# Patient Record
Sex: Female | Born: 1970 | Race: White | Hispanic: No | Marital: Single | Smoking: Never smoker
Health system: Southern US, Community
[De-identification: ages and names within clinical notes are randomized; demographics above are authoritative.]

## PROBLEM LIST (undated history)

## (undated) DIAGNOSIS — F32A Depression, unspecified: Secondary | ICD-10-CM

## (undated) HISTORY — PX: ROUX-EN-Y GASTRIC BYPASS: SHX1104

---

## 2020-10-03 ENCOUNTER — Emergency Department (HOSPITAL_COMMUNITY): Payer: BC Managed Care – PPO

## 2020-10-03 ENCOUNTER — Emergency Department (HOSPITAL_COMMUNITY)
Admission: EM | Admit: 2020-10-03 | Discharge: 2020-10-03 | Disposition: A | Discharge status: Home / Self Care | Payer: BC Managed Care – PPO | Attending: Emergency Medicine | Admitting: Emergency Medicine

## 2020-10-03 DIAGNOSIS — S2231XD Fracture of one rib, right side, subsequent encounter for fracture with routine healing: Secondary | ICD-10-CM | POA: Insufficient documentation

## 2020-10-03 DIAGNOSIS — Y92410 Unspecified street and highway as the place of occurrence of the external cause: Secondary | ICD-10-CM | POA: Insufficient documentation

## 2020-10-03 DIAGNOSIS — Y999 Unspecified external cause status: Secondary | ICD-10-CM | POA: Insufficient documentation

## 2020-10-03 DIAGNOSIS — S199XXA Unspecified injury of neck, initial encounter: Secondary | ICD-10-CM | POA: Diagnosis present

## 2020-10-03 DIAGNOSIS — S161XXA Strain of muscle, fascia and tendon at neck level, initial encounter: Secondary | ICD-10-CM | POA: Diagnosis not present

## 2020-10-03 DIAGNOSIS — Y9389 Activity, other specified: Secondary | ICD-10-CM | POA: Diagnosis not present

## 2020-10-03 DIAGNOSIS — S0990XA Unspecified injury of head, initial encounter: Secondary | ICD-10-CM | POA: Diagnosis not present

## 2020-10-03 DIAGNOSIS — S2231XA Fracture of one rib, right side, initial encounter for closed fracture: Secondary | ICD-10-CM

## 2020-10-03 MED ORDER — FENTANYL CITRATE (PF) 100 MCG/2ML IJ SOLN
50.0000 ug | Freq: Once | INTRAMUSCULAR | Status: AC
  Administered 2020-10-03: 50 ug via INTRAVENOUS
  Filled 2020-10-03: qty 2

## 2020-10-03 MED ORDER — METHOCARBAMOL 500 MG PO TABS: 500.0000 mg | ORAL_TABLET | Freq: Two times a day (BID) | ORAL | 0 refills | Status: DC
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
## 2021-10-05 ENCOUNTER — Observation Stay: Payer: No Typology Code available for payment source

## 2021-10-05 ENCOUNTER — Emergency Department: Payer: No Typology Code available for payment source

## 2021-10-05 ENCOUNTER — Other Ambulatory Visit: Payer: Self-pay

## 2021-10-05 ENCOUNTER — Inpatient Hospital Stay
Admission: EM | Admit: 2021-10-05 | Discharge: 2021-10-16 | DRG: 552 | Disposition: A | Discharge status: Home / Self Care | Payer: No Typology Code available for payment source | Attending: Internal Medicine | Admitting: Internal Medicine

## 2021-10-05 ENCOUNTER — Encounter: Payer: Self-pay | Admitting: Internal Medicine

## 2021-10-05 DIAGNOSIS — E66813 Obesity, class 3: Secondary | ICD-10-CM | POA: Diagnosis present

## 2021-10-05 DIAGNOSIS — Z79899 Other long term (current) drug therapy: Secondary | ICD-10-CM

## 2021-10-05 DIAGNOSIS — S32010A Wedge compression fracture of first lumbar vertebra, initial encounter for closed fracture: Principal | ICD-10-CM | POA: Diagnosis present

## 2021-10-05 DIAGNOSIS — F32A Depression, unspecified: Secondary | ICD-10-CM | POA: Diagnosis present

## 2021-10-05 DIAGNOSIS — N390 Urinary tract infection, site not specified: Secondary | ICD-10-CM | POA: Diagnosis present

## 2021-10-05 DIAGNOSIS — Z6841 Body Mass Index (BMI) 40.0 and over, adult: Secondary | ICD-10-CM

## 2021-10-05 DIAGNOSIS — Y92410 Unspecified street and highway as the place of occurrence of the external cause: Secondary | ICD-10-CM

## 2021-10-05 DIAGNOSIS — Z8249 Family history of ischemic heart disease and other diseases of the circulatory system: Secondary | ICD-10-CM

## 2021-10-05 DIAGNOSIS — S32000A Wedge compression fracture of unspecified lumbar vertebra, initial encounter for closed fracture: Secondary | ICD-10-CM | POA: Diagnosis present

## 2021-10-05 DIAGNOSIS — F419 Anxiety disorder, unspecified: Secondary | ICD-10-CM | POA: Diagnosis present

## 2021-10-05 DIAGNOSIS — Z9884 Bariatric surgery status: Secondary | ICD-10-CM

## 2021-10-05 DIAGNOSIS — J45909 Unspecified asthma, uncomplicated: Secondary | ICD-10-CM | POA: Diagnosis present

## 2021-10-05 DIAGNOSIS — Z9189 Other specified personal risk factors, not elsewhere classified: Secondary | ICD-10-CM

## 2021-10-05 DIAGNOSIS — K5900 Constipation, unspecified: Secondary | ICD-10-CM | POA: Clinically undetermined

## 2021-10-05 DIAGNOSIS — E119 Type 2 diabetes mellitus without complications: Secondary | ICD-10-CM

## 2021-10-05 DIAGNOSIS — Z20822 Contact with and (suspected) exposure to covid-19: Secondary | ICD-10-CM | POA: Diagnosis present

## 2021-10-05 HISTORY — DX: Morbid (severe) obesity due to excess calories: E66.01

## 2021-10-05 HISTORY — DX: Depression, unspecified: F32.A

## 2021-10-05 LAB — CBC WITH DIFFERENTIAL/PLATELET
Abs Immature Granulocytes: 0.2 10*3/uL — ABNORMAL HIGH (ref 0.00–0.07)
Basophils Absolute: 0 10*3/uL (ref 0.0–0.1)
Basophils Relative: 0 %
Eosinophils Absolute: 0 10*3/uL (ref 0.0–0.5)
Eosinophils Relative: 0 %
HCT: 41.6 % (ref 36.0–46.0)
Hemoglobin: 13.2 g/dL (ref 12.0–15.0)
Immature Granulocytes: 1 %
Lymphocytes Relative: 18 %
Lymphs Abs: 2.5 10*3/uL (ref 0.7–4.0)
MCH: 28.8 pg (ref 26.0–34.0)
MCHC: 31.7 g/dL (ref 30.0–36.0)
MCV: 90.6 fL (ref 80.0–100.0)
Monocytes Absolute: 0.4 10*3/uL (ref 0.1–1.0)
Monocytes Relative: 3 %
Neutro Abs: 10.9 10*3/uL — ABNORMAL HIGH (ref 1.7–7.7)
Neutrophils Relative %: 78 %
Platelets: 262 10*3/uL (ref 150–400)
RBC: 4.59 MIL/uL (ref 3.87–5.11)
RDW: 12.9 % (ref 11.5–15.5)
WBC: 14 10*3/uL — ABNORMAL HIGH (ref 4.0–10.5)
nRBC: 0 % (ref 0.0–0.2)

## 2021-10-05 LAB — COMPREHENSIVE METABOLIC PANEL
ALT: 20 U/L (ref 0–44)
AST: 26 U/L (ref 15–41)
Albumin: 4 g/dL (ref 3.5–5.0)
Alkaline Phosphatase: 65 U/L (ref 38–126)
Anion gap: 9 (ref 5–15)
BUN: 19 mg/dL (ref 6–20)
CO2: 21 mmol/L — ABNORMAL LOW (ref 22–32)
Calcium: 9.1 mg/dL (ref 8.9–10.3)
Chloride: 107 mmol/L (ref 98–111)
Creatinine, Ser: 0.65 mg/dL (ref 0.44–1.00)
GFR, Estimated: >60 mL/min (ref >60)
Glucose, Bld: 149 mg/dL — ABNORMAL HIGH (ref 70–99)
Potassium: 4.2 mmol/L (ref 3.5–5.1)
Sodium: 137 mmol/L (ref 135–145)
Total Bilirubin: 0.7 mg/dL (ref 0.3–1.2)
Total Protein: 7.7 g/dL (ref 6.5–8.1)

## 2021-10-05 LAB — PROTIME-INR
INR: 1.1 (ref 0.8–1.2)
Prothrombin Time: 14 seconds (ref 11.4–15.2)

## 2021-10-05 MED ORDER — FENTANYL CITRATE PF 50 MCG/ML IJ SOSY
25.0000 ug | PREFILLED_SYRINGE | INTRAMUSCULAR | Status: AC | PRN
  Administered 2021-10-06 (×3): 25 ug via INTRAVENOUS
  Filled 2021-10-05 (×3): qty 1

## 2021-10-05 MED ORDER — ENOXAPARIN SODIUM 60 MG/0.6ML IJ SOSY
0.5000 mg/kg | PREFILLED_SYRINGE | INTRAMUSCULAR | Status: DC
  Administered 2021-10-06 – 2021-10-15 (×9): 52.5 mg via SUBCUTANEOUS
  Filled 2021-10-05 (×11): qty 0.6

## 2021-10-05 MED ORDER — SODIUM CHLORIDE 0.9 % IV BOLUS
500.0000 mL | Freq: Once | INTRAVENOUS | Status: AC
  Administered 2021-10-05: 21:00:00 500 mL via INTRAVENOUS

## 2021-10-05 MED ORDER — ACETAMINOPHEN 650 MG RE SUPP
650.0000 mg | Freq: Four times a day (QID) | RECTAL | Status: AC | PRN
  Filled 2021-10-05: qty 1

## 2021-10-05 MED ORDER — MORPHINE SULFATE (PF) 2 MG/ML IV SOLN
2.0000 mg | Freq: Four times a day (QID) | INTRAVENOUS | Status: AC | PRN
  Administered 2021-10-06 – 2021-10-07 (×3): 2 mg via INTRAVENOUS
  Filled 2021-10-05 (×4): qty 1

## 2021-10-05 MED ORDER — INSULIN ASPART 100 UNIT/ML IJ SOLN
0.0000 [IU] | Freq: Three times a day (TID) | INTRAMUSCULAR | Status: DC
  Administered 2021-10-06 (×3): 2 [IU] via SUBCUTANEOUS
  Administered 2021-10-07: 3 [IU] via SUBCUTANEOUS
  Administered 2021-10-07 – 2021-10-12 (×4): 2 [IU] via SUBCUTANEOUS
  Administered 2021-10-13: 5 [IU] via SUBCUTANEOUS
  Administered 2021-10-14 – 2021-10-16 (×3): 2 [IU] via SUBCUTANEOUS
  Filled 2021-10-05 (×10): qty 1

## 2021-10-05 MED ORDER — AMITRIPTYLINE HCL 50 MG PO TABS: 25.0000 mg | ORAL_TABLET | Freq: Every day | ORAL | Status: DC

## 2021-10-05 MED ORDER — ONDANSETRON HCL 4 MG/2ML IJ SOLN
4.0000 mg | Freq: Four times a day (QID) | INTRAMUSCULAR | Status: DC | PRN
  Administered 2021-10-06: 4 mg via INTRAVENOUS
  Filled 2021-10-05: qty 2

## 2021-10-05 MED ORDER — OXYCODONE-ACETAMINOPHEN 5-325 MG PO TABS
1.0000 | ORAL_TABLET | Freq: Once | ORAL | Status: AC
  Administered 2021-10-05: 16:00:00 1 via ORAL
  Filled 2021-10-05: qty 1

## 2021-10-05 MED ORDER — SODIUM CHLORIDE 0.9 % IV BOLUS
500.0000 mL | Freq: Once | INTRAVENOUS | Status: AC
  Administered 2021-10-05: 19:00:00 500 mL via INTRAVENOUS

## 2021-10-05 MED ORDER — SEMAGLUTIDE(0.25 OR 0.5MG/DOS) 2 MG/1.5ML ~~LOC~~ SOPN: 0.5000 mg | PEN_INJECTOR | SUBCUTANEOUS | Status: DC

## 2021-10-05 MED ORDER — ONDANSETRON HCL 4 MG PO TABS: 4.0000 mg | ORAL_TABLET | Freq: Four times a day (QID) | ORAL | Status: DC | PRN

## 2021-10-05 MED ORDER — AMITRIPTYLINE HCL 10 MG PO TABS
10.0000 mg | ORAL_TABLET | Freq: Every day | ORAL | Status: DC
  Administered 2021-10-06 – 2021-10-15 (×10): 10 mg via ORAL
  Filled 2021-10-05 (×13): qty 1

## 2021-10-05 MED ORDER — METFORMIN HCL ER 500 MG PO TB24
500.0000 mg | ORAL_TABLET | Freq: Two times a day (BID) | ORAL | Status: DC
  Administered 2021-10-07 – 2021-10-11 (×8): 500 mg via ORAL
  Filled 2021-10-05 (×9): qty 1

## 2021-10-05 MED ORDER — IOHEXOL 300 MG/ML  SOLN
100.0000 mL | Freq: Once | INTRAMUSCULAR | Status: AC | PRN
  Administered 2021-10-05: 21:00:00 100 mL via INTRAVENOUS
  Filled 2021-10-05: qty 100

## 2021-10-05 MED ORDER — TOPIRAMATE 100 MG PO TABS
100.0000 mg | ORAL_TABLET | Freq: Every day | ORAL | Status: DC
  Administered 2021-10-06 – 2021-10-15 (×10): 100 mg via ORAL
  Filled 2021-10-05 (×3): qty 1
  Filled 2021-10-05: qty 4
  Filled 2021-10-05 (×8): qty 1

## 2021-10-05 MED ORDER — INSULIN ASPART 100 UNIT/ML IJ SOLN
0.0000 [IU] | Freq: Every day | INTRAMUSCULAR | Status: DC
  Filled 2021-10-05: qty 1

## 2021-10-05 MED ORDER — DULOXETINE HCL 30 MG PO CPEP
60.0000 mg | ORAL_CAPSULE | Freq: Every day | ORAL | Status: DC
  Administered 2021-10-06 – 2021-10-15 (×11): 60 mg via ORAL
  Filled 2021-10-05 (×5): qty 2
  Filled 2021-10-05: qty 1
  Filled 2021-10-05 (×5): qty 2

## 2021-10-05 MED ORDER — MORPHINE SULFATE (PF) 2 MG/ML IV SOLN
2.0000 mg | Freq: Once | INTRAVENOUS | Status: AC
  Administered 2021-10-05: 19:00:00 2 mg via INTRAVENOUS
  Filled 2021-10-05: qty 1

## 2021-10-05 MED ORDER — ACETAMINOPHEN 325 MG PO TABS
650.0000 mg | ORAL_TABLET | Freq: Four times a day (QID) | ORAL | Status: AC | PRN
  Administered 2021-10-06 (×3): 650 mg via ORAL
  Filled 2021-10-05 (×3): qty 2

## 2021-10-05 MED ORDER — FENTANYL CITRATE PF 50 MCG/ML IJ SOSY
50.0000 ug | PREFILLED_SYRINGE | Freq: Once | INTRAMUSCULAR | Status: AC
  Administered 2021-10-05: 21:00:00 50 ug via INTRAVENOUS
  Filled 2021-10-05: qty 1
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
## 2021-10-05 NOTE — ED Notes (Signed)
Called (jamie) for LSO brace
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
## 2021-10-05 NOTE — Progress Notes (Signed)
PHARMACIST - PHYSICIAN COMMUNICATION  CONCERNING:  Enoxaparin (Lovenox) for DVT Prophylaxis    RECOMMENDATION: Patient was prescribed enoxaprin 40mg  q24 hours for VTE prophylaxis.   Filed Weights   10/05/21 1558  Weight: 106.6 kg (235 lb)    Body mass index is 40.34 kg/m.  Estimated Creatinine Clearance: 98 mL/min (by C-G formula based on SCr of 0.65 mg/dL).   Based on [facility] policy patient is candidate for enoxaparin 0.5mg /kg TBW SQ every 24 hours based on BMI being >30.  DESCRIPTION: Pharmacy has adjusted enoxaparin dose per [facility] policy.  Patient is now receiving enoxaparin 0.5 mg/kg every 24 hours   [facility], PharmD, [facility] 10/05/2021 10:33 PM

## 2021-10-05 NOTE — ED Notes (Signed)
Spoke with Diplomatic Services operational officer who stated she will order brace

## 2021-10-05 NOTE — ED Notes (Signed)
Pt taken for CT 

## 2021-10-06 ENCOUNTER — Observation Stay: Payer: No Typology Code available for payment source

## 2021-10-06 DIAGNOSIS — S32010A Wedge compression fracture of first lumbar vertebra, initial encounter for closed fracture: Principal | ICD-10-CM

## 2021-10-06 DIAGNOSIS — E119 Type 2 diabetes mellitus without complications: Secondary | ICD-10-CM | POA: Diagnosis not present

## 2021-10-06 DIAGNOSIS — F32A Depression, unspecified: Secondary | ICD-10-CM | POA: Diagnosis not present

## 2021-10-06 LAB — BASIC METABOLIC PANEL
Anion gap: 9 (ref 5–15)
BUN: 14 mg/dL (ref 6–20)
CO2: 23 mmol/L (ref 22–32)
Calcium: 9 mg/dL (ref 8.9–10.3)
Chloride: 105 mmol/L (ref 98–111)
Creatinine, Ser: 0.57 mg/dL (ref 0.44–1.00)
GFR, Estimated: >60 mL/min (ref >60)
Glucose, Bld: 170 mg/dL — ABNORMAL HIGH (ref 70–99)
Potassium: 3.6 mmol/L (ref 3.5–5.1)
Sodium: 137 mmol/L (ref 135–145)

## 2021-10-06 LAB — HEMOGLOBIN A1C
Hgb A1c MFr Bld: 6.5 % — ABNORMAL HIGH (ref 4.8–5.6)
Mean Plasma Glucose: 139.85 mg/dL

## 2021-10-06 LAB — GLUCOSE, CAPILLARY: Glucose-Capillary: 115 mg/dL — ABNORMAL HIGH (ref 70–99)

## 2021-10-06 LAB — CBC
HCT: 41.8 % (ref 36.0–46.0)
Hemoglobin: 13.3 g/dL (ref 12.0–15.0)
MCH: 28.9 pg (ref 26.0–34.0)
MCHC: 31.8 g/dL (ref 30.0–36.0)
MCV: 90.9 fL (ref 80.0–100.0)
Platelets: 243 10*3/uL (ref 150–400)
RBC: 4.6 MIL/uL (ref 3.87–5.11)
RDW: 13 % (ref 11.5–15.5)
WBC: 11.1 10*3/uL — ABNORMAL HIGH (ref 4.0–10.5)
nRBC: 0 % (ref 0.0–0.2)

## 2021-10-06 LAB — CBG MONITORING, ED
Glucose-Capillary: 141 mg/dL — ABNORMAL HIGH (ref 70–99)
Glucose-Capillary: 123 mg/dL — ABNORMAL HIGH (ref 70–99)
Glucose-Capillary: 134 mg/dL — ABNORMAL HIGH (ref 70–99)

## 2021-10-06 LAB — ETHANOL: Alcohol, Ethyl (B): <10 mg/dL (ref <10)

## 2021-10-06 LAB — HIV ANTIBODY (ROUTINE TESTING W REFLEX): HIV Screen 4th Generation wRfx: NONREACTIVE

## 2021-10-06 MED ORDER — OXYCODONE HCL 5 MG PO TABS
5.0000 mg | ORAL_TABLET | Freq: Four times a day (QID) | ORAL | Status: DC | PRN
  Administered 2021-10-06 – 2021-10-07 (×3): 5 mg via ORAL
  Filled 2021-10-06 (×3): qty 1

## 2021-10-06 MED ORDER — METHOCARBAMOL 500 MG PO TABS
750.0000 mg | ORAL_TABLET | Freq: Once | ORAL | Status: AC
  Administered 2021-10-06: 750 mg via ORAL
  Filled 2021-10-06: qty 2
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
## 2021-10-06 NOTE — Progress Notes (Signed)
OT Cancellation Note  Patient Details Name: Rebekah Simon MRN: [id number] DOB: 02-23-1971   Cancelled Treatment:    Reason Eval/Treat Not Completed: Other (comment). Consult received, chart reviewed. Neurosurgery consult indicates plan of care pending MRI results, pt NPO. Will hold OT evaluation at this time and re-attempt once plan of care has been established to ensure any necessary precautions are followed with ADL/mobility.    Arman Filter., MPH, MS, OTR/L ascom [phone] 10/06/21, 3:47 PM

## 2021-10-06 NOTE — ED Notes (Signed)
CBG - 141 ° °
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
## 2021-10-06 NOTE — ED Notes (Signed)
Pt taken to MRI  

## 2021-10-06 NOTE — ED Notes (Signed)
Pt back in bed, repositioned for comfort, placed on side per request. Face washed. Sheets changed. Pt appreciative.

## 2021-10-06 NOTE — ED Notes (Signed)
Pts daughter at bedside, pt placed on bedpan, c/o pain with movement.

## 2021-10-06 NOTE — ED Notes (Signed)
Back brace placed per instruction by person handing off brace, pt tolerated well. Pt was at MRI when brace arrived.

## 2021-10-07 DIAGNOSIS — S32010A Wedge compression fracture of first lumbar vertebra, initial encounter for closed fracture: Secondary | ICD-10-CM | POA: Diagnosis not present

## 2021-10-07 DIAGNOSIS — F32A Depression, unspecified: Secondary | ICD-10-CM | POA: Diagnosis not present

## 2021-10-07 DIAGNOSIS — E119 Type 2 diabetes mellitus without complications: Secondary | ICD-10-CM | POA: Diagnosis not present

## 2021-10-07 LAB — URINALYSIS, COMPLETE (UACMP) WITH MICROSCOPIC
Bilirubin Urine: NEGATIVE
Glucose, UA: NEGATIVE mg/dL
Hgb urine dipstick: NEGATIVE
Ketones, ur: NEGATIVE mg/dL
Nitrite: NEGATIVE
Protein, ur: NEGATIVE mg/dL
Specific Gravity, Urine: 1.015 (ref 1.005–1.030)
WBC, UA: >50 WBC/hpf — ABNORMAL HIGH (ref 0–5)
pH: 7 (ref 5.0–8.0)

## 2021-10-07 LAB — RESP PANEL BY RT-PCR (FLU A&B, COVID) ARPGX2
Influenza A by PCR: NEGATIVE
Influenza B by PCR: NEGATIVE
SARS Coronavirus 2 by RT PCR: NEGATIVE

## 2021-10-07 LAB — GLUCOSE, CAPILLARY
Glucose-Capillary: 134 mg/dL — ABNORMAL HIGH (ref 70–99)
Glucose-Capillary: 144 mg/dL — ABNORMAL HIGH (ref 70–99)
Glucose-Capillary: 163 mg/dL — ABNORMAL HIGH (ref 70–99)
Glucose-Capillary: 91 mg/dL (ref 70–99)

## 2021-10-07 LAB — URINE DRUG SCREEN, QUALITATIVE (ARMC ONLY)
Amphetamines, Ur Screen: NOT DETECTED
Barbiturates, Ur Screen: NOT DETECTED
Benzodiazepine, Ur Scrn: NOT DETECTED
Cannabinoid 50 Ng, Ur ~~LOC~~: NOT DETECTED
Cocaine Metabolite,Ur ~~LOC~~: NOT DETECTED
MDMA (Ecstasy)Ur Screen: NOT DETECTED
Methadone Scn, Ur: NOT DETECTED
Opiate, Ur Screen: POSITIVE — AB
Phencyclidine (PCP) Ur S: NOT DETECTED
Tricyclic, Ur Screen: POSITIVE — AB

## 2021-10-07 MED ORDER — KETOROLAC TROMETHAMINE 30 MG/ML IJ SOLN
30.0000 mg | Freq: Four times a day (QID) | INTRAMUSCULAR | Status: AC | PRN
  Administered 2021-10-07 – 2021-10-11 (×9): 30 mg via INTRAVENOUS
  Filled 2021-10-07 (×9): qty 1

## 2021-10-07 MED ORDER — LIDOCAINE 5 % EX PTCH
1.0000 | MEDICATED_PATCH | CUTANEOUS | Status: DC
  Administered 2021-10-07 – 2021-10-15 (×9): 1 via TRANSDERMAL
  Filled 2021-10-07 (×10): qty 1

## 2021-10-07 MED ORDER — TRAMADOL HCL 50 MG PO TABS
100.0000 mg | ORAL_TABLET | Freq: Four times a day (QID) | ORAL | Status: DC | PRN
  Administered 2021-10-07 – 2021-10-16 (×19): 100 mg via ORAL
  Filled 2021-10-07 (×20): qty 2

## 2021-10-07 MED ORDER — OXYCODONE HCL 5 MG PO TABS
7.5000 mg | ORAL_TABLET | Freq: Four times a day (QID) | ORAL | Status: DC | PRN
  Administered 2021-10-07 – 2021-10-16 (×24): 7.5 mg via ORAL
  Filled 2021-10-07 (×24): qty 2
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
## 2021-10-07 NOTE — TOC Initial Note (Signed)
Transition of Care [facility]) - Initial/Assessment Note    Patient Details  Name: Rebekah Simon MRN: [id number] Date of Birth: [birth year]  Transition of Care [facility] CM/SW Contact:    Luvenia Redden, RN Phone Number:[phone] 10/07/2021, 3:56 PM  Clinical Narrative:                 Recommendations for SNF placement for PT. Spoke with both the patient and her daughter Sharyl Nimrod concerning the recommendation. Both receptive and open to any accepting facilities that will accept her insurance for coverage.   Obtained PASRR # [id number] Aand submitted the FL2 to all local SNFs for possible bed offer. Will continue to follow up with any offers and discussed further with pt/daughter.  Pt lives alone in a trailer with support system from her daughter Sharyl Nimrod and her husband. Verified pt will have sufficient transportation upon her discharge from the SNF and able to afford all her ongoing medications from her local pharmacy. Pt recent moved here to [facility] and reports her new address is [address] [place], [place] [address].  TOC will continue to monitor for facility placement for rehab. Will follow up accordingly.  Expected Discharge Plan: Skilled Nursing Facility Barriers to Discharge: Continued Medical Work up   Patient Goals and CMS Choice     Choice offered to / list presented to : Patient  Expected Discharge Plan and Services Expected Discharge Plan: Skilled Nursing Facility   Discharge Planning Services: CM Consult   Living arrangements for the past 2 months: Mobile Home                                      Prior Living Arrangements/Services Living arrangements for the past 2 months: Mobile Home Lives with:: Self Patient language and need for interpreter reviewed:: Yes Do you feel safe going back to the place where you live?: Yes      Need for Family Participation in Patient Care: Yes (Comment) Care giver support system in place?: Yes (comment)    Criminal Activity/Legal Involvement Pertinent to Current Situation/Hospitalization: No - Comment as needed  Activities of Daily Living Home Assistive Devices/Equipment: Brace (specify type) (back) ADL Screening (condition at time of admission) Patient's cognitive ability adequate to safely complete daily activities?: Yes Is the patient deaf or have difficulty hearing?: Yes Does the patient have difficulty seeing, even when wearing glasses/contacts?: Yes Does the patient have difficulty concentrating, remembering, or making decisions?: Yes Patient able to express need for assistance with ADLs?: Yes Does the patient have difficulty dressing or bathing?: No Independently performs ADLs?: Yes (appropriate for developmental age) Does the patient have difficulty walking or climbing stairs?: No Weakness of Legs: Both Weakness of Arms/Hands: Left  Permission Sought/Granted   Permission granted to share information with : Yes, Verbal Permission Granted              Emotional Assessment Appearance:: Appears stated age Attitude/Demeanor/Rapport: Engaged Affect (typically observed): Accepting Orientation: : Oriented to Self, Oriented to Place, Oriented to  Time, Oriented to Situation Alcohol / Substance Use: Not Applicable Psych Involvement: No (comment)  Admission diagnosis:  At risk for inadequate pain control [Z91.89] Patient Active Problem List   Diagnosis Date Noted   Depression    Type 2 diabetes mellitus without complication (HCC)    At risk for inadequate pain control 10/05/2021   Closed compression fracture of L1 lumbar vertebra, initial encounter (HCC) 10/05/2021  Obesity, Class III, BMI 40-49.9 (morbid obesity) (HCC) 10/05/2021   PCP:  Patient, No Pcp Per (Inactive) Pharmacy:   [facility] [address] [place] - [address] [place] [address] Phone: [phone] Fax: [phone]     Social Determinants of Health (SDOH)  Interventions    Readmission Risk Interventions No flowsheet data found.

## 2021-10-07 NOTE — [place] FL2 (Signed)
[place] MEDICAID FL2 LEVEL OF CARE SCREENING TOOL     IDENTIFICATION  Patient Name: Rebekah Simon Birthdate: 1970-12-13 Sex: female Admission Date (Current Location): 10/05/2021  [facility] and [place] Number:  Chiropodist and Address:  [facility], [address] [place], [place] [address]      Provider Number: [id number]  Attending Physician Name and Address:  Hollice Espy, MD  Relative Name and Phone Number:  Jairo Ben [phone]    Current Level of Care: Hospital Recommended Level of Care: Skilled Nursing Facility Prior Approval Number:    Date Approved/Denied: 10/07/21 PASRR Number: [id number]  Discharge Plan: SNF    Current Diagnoses: Patient Active Problem List   Diagnosis Date Noted   Depression    Type 2 diabetes mellitus without complication (HCC)    At risk for inadequate pain control 10/05/2021   Closed compression fracture of L1 lumbar vertebra, initial encounter (HCC) 10/05/2021   Obesity, Class III, BMI 40-49.9 (morbid obesity) (HCC) 10/05/2021    Orientation RESPIRATION BLADDER Height & Weight     Self, Time, Situation, Place  Normal Continent Weight: 106.6 kg Height:  5\' 4"  (162.6 cm)  BEHAVIORAL SYMPTOMS/MOOD NEUROLOGICAL BOWEL NUTRITION STATUS      Continent Diet  AMBULATORY STATUS COMMUNICATION OF NEEDS Skin   Supervision Verbally Normal                       Personal Care Assistance Level of Assistance  Bathing, Dressing Bathing Assistance: Limited assistance   Dressing Assistance: Limited assistance     Functional Limitations Info             SPECIAL CARE FACTORS FREQUENCY  PT (By licensed PT), OT (By licensed OT)     PT Frequency: 5 X weekly OT Frequency: 5 X weekly            Contractures Contractures Info: Not present    Additional Factors Info                  Current Medications (10/07/2021):  This is the current hospital active medication  list Current Facility-Administered Medications  Medication Dose Route Frequency Provider Last Rate Last Admin   acetaminophen (TYLENOL) tablet 650 mg  650 mg Oral Q6H PRN Cox, Amy N, DO   650 mg at 10/06/21 2350   Or   acetaminophen (TYLENOL) suppository 650 mg  650 mg Rectal Q6H PRN Cox, Amy N, DO       amitriptyline (ELAVIL) tablet 10 mg  10 mg Oral QHS Cox, Amy N, DO   10 mg at 10/06/21 2350   DULoxetine (CYMBALTA) DR capsule 60 mg  60 mg Oral QHS Cox, Amy N, DO   60 mg at 10/06/21 2239   enoxaparin (LOVENOX) injection 52.5 mg  0.5 mg/kg Subcutaneous Q24H Cox, Amy N, DO   52.5 mg at 10/06/21 2239   insulin aspart (novoLOG) injection 0-15 Units  0-15 Units Subcutaneous TID WC Cox, Amy N, DO   2 Units at 10/07/21 1243   insulin aspart (novoLOG) injection 0-5 Units  0-5 Units Subcutaneous QHS Cox, Amy N, DO       ketorolac (TORADOL) 30 MG/ML injection 30 mg  30 mg Intravenous Q6H PRN 10/09/21, MD   30 mg at 10/07/21 1535   lidocaine (LIDODERM) 5 % 1 patch  1 patch Transdermal Q24H 10/09/21, MD   1 patch at 10/07/21 1542   metFORMIN (GLUCOPHAGE-XR)  24 hr tablet 500 mg  500 mg Oral BID WC Cox, Amy N, DO       ondansetron (ZOFRAN) tablet 4 mg  4 mg Oral Q6H PRN Cox, Amy N, DO       Or   ondansetron (ZOFRAN) injection 4 mg  4 mg Intravenous Q6H PRN Cox, Amy N, DO   4 mg at 10/06/21 0040   oxyCODONE (Oxy IR/ROXICODONE) immediate release tablet 7.5 mg  7.5 mg Oral Q6H PRN Hollice Espy, MD       topiramate (TOPAMAX) tablet 100 mg  100 mg Oral QHS Cox, Amy N, DO   100 mg at 10/06/21 2239   traMADol (ULTRAM) tablet 100 mg  100 mg Oral Q6H PRN Hollice Espy, MD         Discharge Medications: Please see discharge summary for a list of discharge medications.  Relevant Imaging Results:  Relevant Lab Results:   Additional Information    Ashley Royalty, Lutricia Feil, RN
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
## 2021-10-07 NOTE — Plan of Care (Signed)
3

## 2021-10-08 DIAGNOSIS — F32A Depression, unspecified: Secondary | ICD-10-CM | POA: Diagnosis not present

## 2021-10-08 DIAGNOSIS — Z79899 Other long term (current) drug therapy: Secondary | ICD-10-CM | POA: Diagnosis not present

## 2021-10-08 DIAGNOSIS — Z20822 Contact with and (suspected) exposure to covid-19: Secondary | ICD-10-CM | POA: Diagnosis present

## 2021-10-08 DIAGNOSIS — Z8249 Family history of ischemic heart disease and other diseases of the circulatory system: Secondary | ICD-10-CM | POA: Diagnosis not present

## 2021-10-08 DIAGNOSIS — S32010A Wedge compression fracture of first lumbar vertebra, initial encounter for closed fracture: Secondary | ICD-10-CM | POA: Diagnosis not present

## 2021-10-08 DIAGNOSIS — K5903 Drug induced constipation: Secondary | ICD-10-CM | POA: Diagnosis not present

## 2021-10-08 DIAGNOSIS — S32000A Wedge compression fracture of unspecified lumbar vertebra, initial encounter for closed fracture: Secondary | ICD-10-CM | POA: Diagnosis present

## 2021-10-08 DIAGNOSIS — N390 Urinary tract infection, site not specified: Secondary | ICD-10-CM

## 2021-10-08 DIAGNOSIS — E119 Type 2 diabetes mellitus without complications: Secondary | ICD-10-CM | POA: Diagnosis present

## 2021-10-08 DIAGNOSIS — Z9884 Bariatric surgery status: Secondary | ICD-10-CM | POA: Diagnosis not present

## 2021-10-08 DIAGNOSIS — J45909 Unspecified asthma, uncomplicated: Secondary | ICD-10-CM | POA: Diagnosis present

## 2021-10-08 DIAGNOSIS — K5900 Constipation, unspecified: Secondary | ICD-10-CM | POA: Clinically undetermined

## 2021-10-08 DIAGNOSIS — Y92410 Unspecified street and highway as the place of occurrence of the external cause: Secondary | ICD-10-CM | POA: Diagnosis not present

## 2021-10-08 DIAGNOSIS — F419 Anxiety disorder, unspecified: Secondary | ICD-10-CM | POA: Diagnosis present

## 2021-10-08 DIAGNOSIS — Z6841 Body Mass Index (BMI) 40.0 and over, adult: Secondary | ICD-10-CM | POA: Diagnosis not present

## 2021-10-08 DIAGNOSIS — E1169 Type 2 diabetes mellitus with other specified complication: Secondary | ICD-10-CM | POA: Diagnosis not present

## 2021-10-08 LAB — GLUCOSE, CAPILLARY
Glucose-Capillary: 122 mg/dL — ABNORMAL HIGH (ref 70–99)
Glucose-Capillary: 107 mg/dL — ABNORMAL HIGH (ref 70–99)
Glucose-Capillary: 118 mg/dL — ABNORMAL HIGH (ref 70–99)
Glucose-Capillary: 112 mg/dL — ABNORMAL HIGH (ref 70–99)

## 2021-10-08 MED ORDER — SODIUM CHLORIDE 0.9 % IV SOLN: INTRAVENOUS | Status: DC

## 2021-10-08 MED ORDER — SODIUM CHLORIDE 0.9 % IV SOLN
1.0000 g | INTRAVENOUS | Status: AC
  Administered 2021-10-08 – 2021-10-10 (×3): 1 g via INTRAVENOUS
  Filled 2021-10-08 (×2): qty 1
  Filled 2021-10-08: qty 10

## 2021-10-08 MED ORDER — POLYETHYLENE GLYCOL 3350 17 G PO PACK
17.0000 g | PACK | Freq: Every day | ORAL | Status: DC
  Administered 2021-10-08 – 2021-10-16 (×9): 17 g via ORAL
  Filled 2021-10-08 (×9): qty 1
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
## 2021-10-08 NOTE — Plan of Care (Signed)

## 2021-10-08 NOTE — Progress Notes (Signed)
Inpatient Rehab Admissions Coordinator Note:   Per PT/OT patient was screened for CIR candidacy by Myriam Brandhorst Luvenia Starch, CCC-SLP. Note pt is under observation status at this time. Pt may not have the medical necessity to warrant an inpatient rehab stay if they remain observation. It status were to change to inpatient, [facility] will screen for candidacy.      Wolfgang Phoenix, MS, CCC-SLP Admissions Coordinator [phone] 10/08/21 2:19 PM
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
## 2021-10-09 LAB — URINE CULTURE: Culture: <10000 — AB

## 2021-10-09 LAB — GLUCOSE, CAPILLARY
Glucose-Capillary: 86 mg/dL (ref 70–99)
Glucose-Capillary: 112 mg/dL — ABNORMAL HIGH (ref 70–99)
Glucose-Capillary: 116 mg/dL — ABNORMAL HIGH (ref 70–99)

## 2021-10-09 MED ORDER — LACTULOSE 10 GM/15ML PO SOLN
10.0000 g | Freq: Once | ORAL | Status: AC
  Administered 2021-10-09: 10 g via ORAL
  Filled 2021-10-09: qty 30

## 2021-10-09 MED ORDER — BACID PO TABS
1.0000 | ORAL_TABLET | Freq: Every day | ORAL | Status: DC
  Filled 2021-10-09: qty 1

## 2021-10-09 MED ORDER — RISAQUAD PO CAPS
1.0000 | ORAL_CAPSULE | Freq: Every day | ORAL | Status: DC
  Administered 2021-10-09 – 2021-10-16 (×8): 1 via ORAL
  Filled 2021-10-09 (×8): qty 1

## 2021-10-09 NOTE — TOC Progression Note (Signed)
Transition of Care [facility]) - Progression Note    Patient Details  Name: DARI CARPENITO MRN: [id number] Date of Birth: November 10, 1971  [facility] CM/SW Contact  Chapman Fitch, RN Phone Number: 10/09/2021, 1:44 PM  Clinical Narrative:     Per CIR "pt is too high functioning and does not meet medical necessity for CIR" PT recommending SNF No bed offers, bed search extended   Expected Discharge Plan: Skilled Nursing Facility Barriers to Discharge: Continued Medical Work up  Expected Discharge Plan and Services Expected Discharge Plan: Skilled Nursing Facility   Discharge Planning Services: CM Consult   Living arrangements for the past 2 months: Mobile Home                                       Social Determinants of Health (SDOH) Interventions    Readmission Risk Interventions No flowsheet data found.
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
## 2021-10-09 NOTE — Progress Notes (Signed)
Inpatient Rehab Admissions Coordinator Note:   Per PT/OT patient was screened for CIR candidacy by Rilea Arutyunyan Luvenia Starch, CCC-SLP. At this time, pt is too high functioning and does not meet medical necessity for CIR. Will not pursue a rehab consult for this pt. Recommend other rehab venues to be pursued.     Wolfgang Phoenix, MS, CCC-SLP Admissions Coordinator [phone] 10/09/21 9:04 AM

## 2021-10-10 LAB — CBC
HCT: 36.5 % (ref 36.0–46.0)
Hemoglobin: 11.6 g/dL — ABNORMAL LOW (ref 12.0–15.0)
MCH: 29.4 pg (ref 26.0–34.0)
MCHC: 31.8 g/dL (ref 30.0–36.0)
MCV: 92.4 fL (ref 80.0–100.0)
Platelets: 211 10*3/uL (ref 150–400)
RBC: 3.95 MIL/uL (ref 3.87–5.11)
RDW: 12.5 % (ref 11.5–15.5)
WBC: 7.9 10*3/uL (ref 4.0–10.5)
nRBC: 0 % (ref 0.0–0.2)

## 2021-10-10 LAB — GLUCOSE, CAPILLARY
Glucose-Capillary: 97 mg/dL (ref 70–99)
Glucose-Capillary: 118 mg/dL — ABNORMAL HIGH (ref 70–99)
Glucose-Capillary: 89 mg/dL (ref 70–99)
Glucose-Capillary: 152 mg/dL — ABNORMAL HIGH (ref 70–99)

## 2021-10-10 LAB — BASIC METABOLIC PANEL
Anion gap: 7 (ref 5–15)
BUN: 20 mg/dL (ref 6–20)
CO2: 23 mmol/L (ref 22–32)
Calcium: 8.4 mg/dL — ABNORMAL LOW (ref 8.9–10.3)
Chloride: 107 mmol/L (ref 98–111)
Creatinine, Ser: 0.52 mg/dL (ref 0.44–1.00)
GFR, Estimated: >60 mL/min (ref >60)
Glucose, Bld: 120 mg/dL — ABNORMAL HIGH (ref 70–99)
Potassium: 4 mmol/L (ref 3.5–5.1)
Sodium: 137 mmol/L (ref 135–145)

## 2021-10-10 MED ORDER — DOCUSATE SODIUM 283 MG RE ENEM
1.0000 | ENEMA | Freq: Every day | RECTAL | Status: DC | PRN
  Filled 2021-10-10: qty 1

## 2021-10-10 MED ORDER — LACTULOSE 10 GM/15ML PO SOLN
10.0000 g | Freq: Every day | ORAL | Status: DC
  Administered 2021-10-10 – 2021-10-12 (×3): 10 g via ORAL
  Filled 2021-10-10 (×4): qty 30

## 2021-10-10 MED ORDER — BISACODYL 5 MG PO TBEC
10.0000 mg | DELAYED_RELEASE_TABLET | Freq: Once | ORAL | Status: AC
  Administered 2021-10-10: 10 mg via ORAL
  Filled 2021-10-10: qty 2

## 2021-10-10 NOTE — TOC Progression Note (Signed)
Transition of Care [facility]) - Progression Note    Patient Details  Name: Rebekah Simon MRN: [id number] Date of Birth: May 22, 1971  [facility] CM/SW Contact  Chapman Fitch, RN Phone Number: 10/10/2021, 10:36 AM  Clinical Narrative:     Bed offer presented Patient accepts offer at [facility] Accepted in [place], [place] left for Amber in admissions  Requested for them to start auth and return my call   Expected Discharge Plan: Skilled Nursing Facility Barriers to Discharge: Continued Medical Work up  Expected Discharge Plan and Services Expected Discharge Plan: Skilled Nursing Facility   Discharge Planning Services: CM Consult   Living arrangements for the past 2 months: Mobile Home                                       Social Determinants of Health (SDOH) Interventions    Readmission Risk Interventions No flowsheet data found.
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
## 2021-10-10 NOTE — TOC Progression Note (Signed)
Transition of Care [facility]) - Progression Note    Patient Details  Name: Rebekah Simon MRN: [id number] Date of Birth: Apr 30, 1971  [facility]) CM/SW Contact  Chapman Fitch, RN Phone Number: 10/10/2021, 3:14 PM  Clinical Narrative:     [name] with [facility] at [facility].  She confirms they can offer a bed and will start insurance auth today  Expected Discharge Plan: Skilled Nursing Facility Barriers to Discharge: Continued Medical Work up  Expected Discharge Plan and Services Expected Discharge Plan: Skilled Nursing Facility   Discharge Planning Services: CM Consult   Living arrangements for the past 2 months: Mobile Home                                       Social Determinants of Health (SDOH) Interventions    Readmission Risk Interventions No flowsheet data found.
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
## 2021-10-11 DIAGNOSIS — E1169 Type 2 diabetes mellitus with other specified complication: Secondary | ICD-10-CM

## 2021-10-11 LAB — GLUCOSE, CAPILLARY
Glucose-Capillary: 104 mg/dL — ABNORMAL HIGH (ref 70–99)
Glucose-Capillary: 82 mg/dL (ref 70–99)
Glucose-Capillary: 72 mg/dL (ref 70–99)
Glucose-Capillary: 182 mg/dL — ABNORMAL HIGH (ref 70–99)
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
## 2021-10-11 NOTE — TOC Progression Note (Signed)
Transition of Care [facility]) - Progression Note    Patient Details  Name: Rebekah Simon MRN: [id number] Date of Birth: 02-Jul-1971  Transition of Care [facility]) CM/SW Contact  Chapman Fitch, RN Phone Number: 10/11/2021, 10:36 AM  Clinical Narrative:     SNF insurance auth pending Patient updated   Expected Discharge Plan: Skilled Nursing Facility Barriers to Discharge: Continued Medical Work up  Expected Discharge Plan and Services Expected Discharge Plan: Skilled Nursing Facility   Discharge Planning Services: CM Consult   Living arrangements for the past 2 months: Mobile Home                                       Social Determinants of Health (SDOH) Interventions    Readmission Risk Interventions No flowsheet data found.

## 2021-10-11 NOTE — Plan of Care (Signed)
Problem: Education: Goal: Knowledge of General Education information will improve Description: Including pain rating scale, medication(s)/side effects and non-pharmacologic comfort measures Outcome: Progressing   Problem: Health Behavior/Discharge Planning: Goal: Ability to manage health-related needs will improve Outcome: Progressing   Problem: Clinical Measurements: Goal: Ability to maintain clinical measurements within normal limits will improve Outcome: Progressing Goal: Respiratory complications will improve Outcome: Progressing   Problem: Activity: Goal: Risk for activity intolerance will decrease Outcome: Progressing   Problem: Nutrition: Goal: Adequate nutrition will be maintained Outcome: Progressing   Problem: Coping: Goal: Level of anxiety will decrease Outcome: Progressing   Problem: Elimination: Goal: Will not experience complications related to bowel motility Outcome: Progressing Goal: Will not experience complications related to urinary retention Outcome: Progressing   Problem: Pain Managment: Goal: General experience of comfort will improve Outcome: Progressing   Problem: Safety: Goal: Ability to remain free from injury will improve Outcome: Progressing   Problem: Skin Integrity: Goal: Risk for impaired skin integrity will decrease Outcome: Progressing   

## 2021-10-11 NOTE — Progress Notes (Signed)
PROGRESS NOTE    Rebekah Simon  IWO:[id number] DOB: 1971/06/12 DOA: 10/05/2021 PCP: Patient, No Pcp Per (Inactive)    Assessment & Plan:   Principal Problem:   Closed compression fracture of L1 lumbar vertebra, initial encounter [facility]) Active Problems:   At risk for inadequate pain control   Obesity, Class III, BMI 40-49.9 (morbid obesity) (HCC)   Depression   Type 2 diabetes mellitus without complication (HCC)   Acute lower UTI   Constipation   Lumbar compression fracture, closed, initial encounter (HCC)   Closed compression fracture of L1 lumbar vertebra: continue on oxycodone prn for pain control. PT/OT recs SNF. Waiting on insurance auth still    Morbid obesity: BMI 40.3. Complicates overall care & prognosis    Depression: severity unknown. Continue on home dose of duloxetine   DM2: likely poorly controlled. Continue on SSI w/ accuchecks    Leukocytosis: resolved    Constipation: responded to Fleets enema. Continue on lactulose   UTI: completed abx course. Urine cx showed insignificant growth    DVT prophylaxis: lovenox  Code Status: full  Family Communication:  Disposition Plan: d/c to SNF, waiting on insurance auth   Level of care: Med-Surg  Status is: Inpatient  Remains inpatient appropriate because: medically stable for d/c, waiting on insurance auth    Consultants:  Neuro surg   Procedures:   Antimicrobials:   Subjective: Pt c/o back pain   Objective: Vitals:   10/10/21 1501 10/10/21 2025 10/11/21 0506 10/11/21 0805  BP: 105/64 110/71 91/64 108/72  Pulse: 94 72 68 79  Resp:  16 16 18   Temp: 98 F (36.7 C) 98.2 F (36.8 C) 97.6 F (36.4 C) 97.8 F (36.6 C)  TempSrc: Oral Oral Oral Oral  SpO2: 96% 100% 100% 98%  Weight:      Height:        Intake/Output Summary (Last 24 hours) at 10/11/2021 0831 Last data filed at 10/10/2021 1500 Gross per 24 hour  Intake 865 ml  Output 1402 ml  Net -537 ml   Filed Weights   10/05/21  1558  Weight: 106.6 kg    Examination:  General exam: Appears calm and comfortable but lethargic  Respiratory system: Clear to auscultation. Respiratory effort normal. Cardiovascular system: S1 & S2 +. No rubs, gallops or clicks.  Gastrointestinal system: Abdomen is obese, soft and nontender. Normal bowel sounds heard. Central nervous system: Alert and oriented. Moves all extremities  Psychiatry: Judgement and insight appear normal. Flat mood and affect     Data Reviewed: I have personally reviewed following labs and imaging studies  CBC: Recent Labs  Lab 10/05/21 2000 10/06/21 0616 10/10/21 0309  WBC 14.0* 11.1* 7.9  NEUTROABS 10.9*  --   --   HGB 13.2 13.3 11.6*  HCT 41.6 41.8 36.5  MCV 90.6 90.9 92.4  PLT 262 243 211   Basic Metabolic Panel: Recent Labs  Lab 10/05/21 2000 10/06/21 0616 10/10/21 0309  NA 137 137 137  K 4.2 3.6 4.0  CL 107 105 107  CO2 21* 23 23  GLUCOSE 149* 170* 120*  BUN 19 14 20   CREATININE 0.65 0.57 0.52  CALCIUM 9.1 9.0 8.4*   GFR: Estimated Creatinine Clearance: 98 mL/min (by C-G formula based on SCr of 0.52 mg/dL). Liver Function Tests: Recent Labs  Lab 10/05/21 2000  AST 26  ALT 20  ALKPHOS 65  BILITOT 0.7  PROT 7.7  ALBUMIN 4.0   No results for input(s): LIPASE, AMYLASE in the last  168 hours. No results for input(s): AMMONIA in the last 168 hours. Coagulation Profile: Recent Labs  Lab 10/05/21 2000  INR 1.1   Cardiac Enzymes: No results for input(s): CKTOTAL, CKMB, CKMBINDEX, TROPONINI in the last 168 hours. BNP (last 3 results) No results for input(s): PROBNP in the last 8760 hours. HbA1C: No results for input(s): HGBA1C in the last 72 hours. CBG: Recent Labs  Lab 10/10/21 0743 10/10/21 1115 10/10/21 1605 10/10/21 2126 10/11/21 0802  GLUCAP 97 118* 89 152* 104*   Lipid Profile: No results for input(s): CHOL, HDL, LDLCALC, TRIG, CHOLHDL, LDLDIRECT in the last 72 hours. Thyroid Function Tests: No results  for input(s): TSH, T4TOTAL, FREET4, T3FREE, THYROIDAB in the last 72 hours. Anemia Panel: No results for input(s): VITAMINB12, FOLATE, FERRITIN, TIBC, IRON, RETICCTPCT in the last 72 hours. Sepsis Labs: No results for input(s): PROCALCITON, LATICACIDVEN in the last 168 hours.  Recent Results (from the past 240 hour(s))  Resp Panel by RT-PCR (Flu A&B, Covid) Nasopharyngeal Swab     Status: None   Collection Time: 10/07/21  8:20 AM   Specimen: Nasopharyngeal Swab; Nasopharyngeal(NP) swabs in vial transport medium  Result Value Ref Range Status   SARS Coronavirus 2 by RT PCR NEGATIVE NEGATIVE Final    Comment: (NOTE) SARS-CoV-2 target nucleic acids are NOT DETECTED.  The SARS-CoV-2 RNA is generally detectable in upper respiratory specimens during the acute phase of infection. The lowest concentration of SARS-CoV-2 viral copies this assay can detect is 138 copies/mL. A negative result does not preclude SARS-Cov-2 infection and should not be used as the sole basis for treatment or other patient management decisions. A negative result may occur with  improper specimen collection/handling, submission of specimen other than nasopharyngeal swab, presence of viral mutation(s) within the areas targeted by this assay, and inadequate number of viral copies(<138 copies/mL). A negative result must be combined with clinical observations, patient history, and epidemiological information. The expected result is Negative.  Fact Sheet for Patients:  BloggerCourse.com  Fact Sheet for Healthcare Providers:  SeriousBroker.it  This test is no t yet approved or cleared by the [place] FDA and  has been authorized for detection and/or diagnosis of SARS-CoV-2 by FDA under an Emergency Use Authorization (EUA). This EUA will remain  in effect (meaning this test can be used) for the duration of the COVID-19 declaration under Section 564(b)(1) of the Act,  21 U.S.C.section 360bbb-3(b)(1), unless the authorization is terminated  or revoked sooner.       Influenza A by PCR NEGATIVE NEGATIVE Final   Influenza B by PCR NEGATIVE NEGATIVE Final    Comment: (NOTE) The Xpert Xpress SARS-CoV-2/FLU/RSV plus assay is intended as an aid in the diagnosis of influenza from Nasopharyngeal swab specimens and should not be used as a sole basis for treatment. Nasal washings and aspirates are unacceptable for Xpert Xpress SARS-CoV-2/FLU/RSV testing.  Fact Sheet for Patients: BloggerCourse.com  Fact Sheet for Healthcare Providers: SeriousBroker.it  This test is not yet approved or cleared by the [place] FDA and has been authorized for detection and/or diagnosis of SARS-CoV-2 by FDA under an Emergency Use Authorization (EUA). This EUA will remain in effect (meaning this test can be used) for the duration of the COVID-19 declaration under Section 564(b)(1) of the Act, 21 U.S.C. section 360bbb-3(b)(1), unless the authorization is terminated or revoked.  Performed at [facility], [address], [place], [place] [address]   Urine Culture     Status: Abnormal   Collection Time:  10/08/21 10:23 AM   Specimen: Urine, Random  Result Value Ref Range Status   Specimen Description   Final    URINE, RANDOM Performed at [facility], [address], [place], [place] [address]    Special Requests   Final    NONE Performed at [facility], [address] [place], [place] [address]    Culture (A)  Final    <10,000 COLONIES/mL INSIGNIFICANT GROWTH Performed at [facility], [address], [place], [place] [address]    Report Status 10/09/2021 FINAL  Final         Radiology Studies: No results found.      Scheduled Meds:  acidophilus  1 capsule Oral Daily   amitriptyline  10 mg Oral QHS   DULoxetine  60 mg Oral QHS   enoxaparin (LOVENOX) injection   0.5 mg/kg Subcutaneous Q24H   insulin aspart  0-15 Units Subcutaneous TID WC   insulin aspart  0-5 Units Subcutaneous QHS   lactulose  10 g Oral Daily   lidocaine  1 patch Transdermal Q24H   metFORMIN  500 mg Oral BID WC   polyethylene glycol  17 g Oral Daily   topiramate  100 mg Oral QHS   Continuous Infusions:  sodium chloride 75 mL/hr at 10/11/21 0488     LOS: 3 days    Time spent: 20 mins     Charise Killian, MD [facility] Pager [phone]  If 7PM-7AM, please contact night-coverage 10/11/2021, 8:31 AM
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
## 2021-10-12 LAB — BASIC METABOLIC PANEL
Anion gap: 5 (ref 5–15)
BUN: 15 mg/dL (ref 6–20)
CO2: 25 mmol/L (ref 22–32)
Calcium: 8.9 mg/dL (ref 8.9–10.3)
Chloride: 109 mmol/L (ref 98–111)
Creatinine, Ser: 0.73 mg/dL (ref 0.44–1.00)
GFR, Estimated: >60 mL/min (ref >60)
Glucose, Bld: 114 mg/dL — ABNORMAL HIGH (ref 70–99)
Potassium: 4.3 mmol/L (ref 3.5–5.1)
Sodium: 139 mmol/L (ref 135–145)

## 2021-10-12 LAB — GLUCOSE, CAPILLARY
Glucose-Capillary: 102 mg/dL — ABNORMAL HIGH (ref 70–99)
Glucose-Capillary: 144 mg/dL — ABNORMAL HIGH (ref 70–99)
Glucose-Capillary: 77 mg/dL (ref 70–99)
Glucose-Capillary: 197 mg/dL — ABNORMAL HIGH (ref 70–99)

## 2021-10-12 LAB — CBC
HCT: 37.6 % (ref 36.0–46.0)
Hemoglobin: 12 g/dL (ref 12.0–15.0)
MCH: 29.4 pg (ref 26.0–34.0)
MCHC: 31.9 g/dL (ref 30.0–36.0)
MCV: 92.2 fL (ref 80.0–100.0)
Platelets: 217 10*3/uL (ref 150–400)
RBC: 4.08 MIL/uL (ref 3.87–5.11)
RDW: 12.5 % (ref 11.5–15.5)
WBC: 8.3 10*3/uL (ref 4.0–10.5)
nRBC: 0 % (ref 0.0–0.2)

## 2021-10-12 NOTE — Progress Notes (Signed)
PROGRESS NOTE    Rebekah Simon  ZDG:[id number] DOB: [birth year] DOA: 10/05/2021 PCP: Patient, No Pcp Per (Inactive)    Assessment & Plan:   Principal Problem:   Closed compression fracture of L1 lumbar vertebra, initial encounter [facility]) Active Problems:   At risk for inadequate pain control   Obesity, Class III, BMI 40-49.9 (morbid obesity) (HCC)   Depression   Type 2 diabetes mellitus without complication (HCC)   Acute lower UTI   Constipation   Lumbar compression fracture, closed, initial encounter (HCC)   Closed compression fracture of L1 lumbar vertebra: continue on oxycodone, tramadol prn for pain. PT/OT recs SNF. Still waiting on insurance auth    Morbid obesity: BMI 40.3. Complicates overall care & prognosis    Depression: severity unknown. Continue on home dose of duloxetine    DM2: pretty well controlled w/ HbA1c 6.5. Continue on SSI w/ accuchecks    Leukocytosis: resolved    Constipation: responded to fleet enemas .  Continue on lactulose    UTI: completed abx course. Urine cx showed insignificant growth    DVT prophylaxis: lovenox  Code Status: full  Family Communication:  Disposition Plan: d/c to SNF, waiting on insurance auth   Level of care: Med-Surg  Status is: Inpatient  Remains inpatient appropriate because: medically stable for d/c, waiting on insurance auth    Consultants:  Neuro surg   Procedures:   Antimicrobials:   Subjective: Pt c/o fatigue   Objective: Vitals:   10/11/21 1614 10/11/21 2008 10/12/21 0404 10/12/21 0731  BP: 107/68 109/83 112/71 108/70  Pulse: 79 75 67 78  Resp: 18 20 20 14   Temp: 97.7 F (36.5 C) 97.8 F (36.6 C) 97.7 F (36.5 C) 97.6 F (36.4 C)  TempSrc: Oral Oral Oral Oral  SpO2: 100% 100% 100% 97%  Weight:      Height:        Intake/Output Summary (Last 24 hours) at 10/12/2021 0735 Last data filed at 10/12/2021 0405 Gross per 24 hour  Intake 2560.25 ml  Output --  Net 2560.25 ml    Filed Weights   10/05/21 1558  Weight: 106.6 kg    Examination:  General exam: Appears calm & comfortable  Respiratory system: clear breath sounds b/l  Cardiovascular system: S1/S2+. No rubs or clicks  Gastrointestinal system: Abd is soft, NT, obese & normal bowel sounds  Central nervous system: alert and oriented. Moves all extremities  Psychiatry: judgement and insight appear normal. Flat mood and affect    Data Reviewed: I have personally reviewed following labs and imaging studies  CBC: Recent Labs  Lab 10/05/21 2000 10/06/21 0616 10/10/21 0309 10/12/21 0419  WBC 14.0* 11.1* 7.9 8.3  NEUTROABS 10.9*  --   --   --   HGB 13.2 13.3 11.6* 12.0  HCT 41.6 41.8 36.5 37.6  MCV 90.6 90.9 92.4 92.2  PLT 262 243 211 217   Basic Metabolic Panel: Recent Labs  Lab 10/05/21 2000 10/06/21 0616 10/10/21 0309 10/12/21 0419  NA 137 137 137 139  K 4.2 3.6 4.0 4.3  CL 107 105 107 109  CO2 21* 23 23 25   GLUCOSE 149* 170* 120* 114*  BUN 19 14 20 15   CREATININE 0.65 0.57 0.52 0.73  CALCIUM 9.1 9.0 8.4* 8.9   GFR: Estimated Creatinine Clearance: 98 mL/min (by C-G formula based on SCr of 0.73 mg/dL). Liver Function Tests: Recent Labs  Lab 10/05/21 2000  AST 26  ALT 20  ALKPHOS 65  BILITOT 0.7  PROT 7.7  ALBUMIN 4.0   No results for input(s): LIPASE, AMYLASE in the last 168 hours. No results for input(s): AMMONIA in the last 168 hours. Coagulation Profile: Recent Labs  Lab 10/05/21 2000  INR 1.1   Cardiac Enzymes: No results for input(s): CKTOTAL, CKMB, CKMBINDEX, TROPONINI in the last 168 hours. BNP (last 3 results) No results for input(s): PROBNP in the last 8760 hours. HbA1C: No results for input(s): HGBA1C in the last 72 hours. CBG: Recent Labs  Lab 10/10/21 2126 10/11/21 0802 10/11/21 1200 10/11/21 1643 10/11/21 2122  GLUCAP 152* 104* 82 72 182*   Lipid Profile: No results for input(s): CHOL, HDL, LDLCALC, TRIG, CHOLHDL, LDLDIRECT in the last 72  hours. Thyroid Function Tests: No results for input(s): TSH, T4TOTAL, FREET4, T3FREE, THYROIDAB in the last 72 hours. Anemia Panel: No results for input(s): VITAMINB12, FOLATE, FERRITIN, TIBC, IRON, RETICCTPCT in the last 72 hours. Sepsis Labs: No results for input(s): PROCALCITON, LATICACIDVEN in the last 168 hours.  Recent Results (from the past 240 hour(s))  Resp Panel by RT-PCR (Flu A&B, Covid) Nasopharyngeal Swab     Status: None   Collection Time: 10/07/21  8:20 AM   Specimen: Nasopharyngeal Swab; Nasopharyngeal(NP) swabs in vial transport medium  Result Value Ref Range Status   SARS Coronavirus 2 by RT PCR NEGATIVE NEGATIVE Final    Comment: (NOTE) SARS-CoV-2 target nucleic acids are NOT DETECTED.  The SARS-CoV-2 RNA is generally detectable in upper respiratory specimens during the acute phase of infection. The lowest concentration of SARS-CoV-2 viral copies this assay can detect is 138 copies/mL. A negative result does not preclude SARS-Cov-2 infection and should not be used as the sole basis for treatment or other patient management decisions. A negative result may occur with  improper specimen collection/handling, submission of specimen other than nasopharyngeal swab, presence of viral mutation(s) within the areas targeted by this assay, and inadequate number of viral copies(<138 copies/mL). A negative result must be combined with clinical observations, patient history, and epidemiological information. The expected result is Negative.  Fact Sheet for Patients:  BloggerCourse.com  Fact Sheet for Healthcare Providers:  SeriousBroker.it  This test is no t yet approved or cleared by the [place] FDA and  has been authorized for detection and/or diagnosis of SARS-CoV-2 by FDA under an Emergency Use Authorization (EUA). This EUA will remain  in effect (meaning this test can be used) for the duration of the COVID-19  declaration under Section 564(b)(1) of the Act, 21 U.S.C.section 360bbb-3(b)(1), unless the authorization is terminated  or revoked sooner.       Influenza A by PCR NEGATIVE NEGATIVE Final   Influenza B by PCR NEGATIVE NEGATIVE Final    Comment: (NOTE) The Xpert Xpress SARS-CoV-2/FLU/RSV plus assay is intended as an aid in the diagnosis of influenza from Nasopharyngeal swab specimens and should not be used as a sole basis for treatment. Nasal washings and aspirates are unacceptable for Xpert Xpress SARS-CoV-2/FLU/RSV testing.  Fact Sheet for Patients: BloggerCourse.com  Fact Sheet for Healthcare Providers: SeriousBroker.it  This test is not yet approved or cleared by the [place] FDA and has been authorized for detection and/or diagnosis of SARS-CoV-2 by FDA under an Emergency Use Authorization (EUA). This EUA will remain in effect (meaning this test can be used) for the duration of the COVID-19 declaration under Section 564(b)(1) of the Act, 21 U.S.C. section 360bbb-3(b)(1), unless the authorization is terminated or revoked.  Performed at [facility], [address] [place],  [place] [address]   Urine Culture     Status: Abnormal   Collection Time: 10/08/21 10:23 AM   Specimen: Urine, Random  Result Value Ref Range Status   Specimen Description   Final    URINE, RANDOM Performed at [facility], [address], [place], [place] [address]    Special Requests   Final    NONE Performed at [facility], [address] [place], [place] [address]    Culture (A)  Final    <10,000 COLONIES/mL INSIGNIFICANT GROWTH Performed at [facility], [address], [place], [place] [address]    Report Status 10/09/2021 FINAL  Final         Radiology Studies: No results found.      Scheduled Meds:  acidophilus  1 capsule Oral Daily   amitriptyline  10 mg Oral QHS   DULoxetine  60  mg Oral QHS   enoxaparin (LOVENOX) injection  0.5 mg/kg Subcutaneous Q24H   insulin aspart  0-15 Units Subcutaneous TID WC   insulin aspart  0-5 Units Subcutaneous QHS   lactulose  10 g Oral Daily   lidocaine  1 patch Transdermal Q24H   polyethylene glycol  17 g Oral Daily   topiramate  100 mg Oral QHS   Continuous Infusions:     LOS: 4 days    Time spent: 15 mins     Charise Killian, MD [facility] Pager [phone]  If 7PM-7AM, please contact night-coverage 10/12/2021, 7:35 AM
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
## 2021-10-13 LAB — CBC
HCT: 38.3 % (ref 36.0–46.0)
Hemoglobin: 12.1 g/dL (ref 12.0–15.0)
MCH: 28.9 pg (ref 26.0–34.0)
MCHC: 31.6 g/dL (ref 30.0–36.0)
MCV: 91.6 fL (ref 80.0–100.0)
Platelets: 233 10*3/uL (ref 150–400)
RBC: 4.18 MIL/uL (ref 3.87–5.11)
RDW: 12.6 % (ref 11.5–15.5)
WBC: 8.7 10*3/uL (ref 4.0–10.5)
nRBC: 0 % (ref 0.0–0.2)

## 2021-10-13 LAB — BASIC METABOLIC PANEL
Anion gap: 6 (ref 5–15)
BUN: 18 mg/dL (ref 6–20)
CO2: 25 mmol/L (ref 22–32)
Calcium: 8.8 mg/dL — ABNORMAL LOW (ref 8.9–10.3)
Chloride: 106 mmol/L (ref 98–111)
Creatinine, Ser: 0.67 mg/dL (ref 0.44–1.00)
GFR, Estimated: >60 mL/min (ref >60)
Glucose, Bld: 118 mg/dL — ABNORMAL HIGH (ref 70–99)
Potassium: 4.2 mmol/L (ref 3.5–5.1)
Sodium: 137 mmol/L (ref 135–145)

## 2021-10-13 LAB — GLUCOSE, CAPILLARY
Glucose-Capillary: 216 mg/dL — ABNORMAL HIGH (ref 70–99)
Glucose-Capillary: 78 mg/dL (ref 70–99)
Glucose-Capillary: 114 mg/dL — ABNORMAL HIGH (ref 70–99)
Glucose-Capillary: 156 mg/dL — ABNORMAL HIGH (ref 70–99)

## 2021-10-13 MED ORDER — LACTULOSE 10 GM/15ML PO SOLN
20.0000 g | Freq: Every day | ORAL | Status: DC
  Administered 2021-10-13 – 2021-10-16 (×4): 20 g via ORAL
  Filled 2021-10-13 (×3): qty 30

## 2021-10-13 MED ORDER — SUMATRIPTAN SUCCINATE 50 MG PO TABS
25.0000 mg | ORAL_TABLET | Freq: Once | ORAL | Status: AC
  Administered 2021-10-13: 25 mg via ORAL
  Filled 2021-10-13: qty 1

## 2021-10-13 NOTE — TOC Progression Note (Signed)
Transition of Care [facility]) - Progression Note    Patient Details  Name: Rebekah Simon MRN: [id number] Date of Birth: May 26, 1971  Transition of Care [facility]) CM/SW Contact  Hetty Ely, RN Phone Number: 10/13/2021, 4:55 PM  Clinical Narrative: Called SNF [facility] to get admission clearance, no answer LVM.      Expected Discharge Plan: Skilled Nursing Facility Barriers to Discharge: Continued Medical Work up  Expected Discharge Plan and Services Expected Discharge Plan: Skilled Nursing Facility   Discharge Planning Services: CM Consult   Living arrangements for the past 2 months: Mobile Home                                       Social Determinants of Health (SDOH) Interventions    Readmission Risk Interventions No flowsheet data found.

## 2021-10-13 NOTE — Progress Notes (Signed)
PT Cancellation Note  Patient Details Name: Rebekah Simon MRN: [id number] DOB: 1971-01-28   Cancelled Treatment:    PT attempt. PT hold. Pt is currently c/o severe headache and requesting author return later. Acute PT will continue to follow and progress as able per current POC.    Rushie Chestnut 10/13/2021, 9:24 AM
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
## 2021-10-13 NOTE — Progress Notes (Signed)
PROGRESS NOTE    Rebekah Simon  WVP:[id number] DOB: 1971/06/03 DOA: 10/05/2021 PCP: Patient, No Pcp Per (Inactive)    Assessment & Plan:   Principal Problem:   Closed compression fracture of L1 lumbar vertebra, initial encounter [facility]) Active Problems:   At risk for inadequate pain control   Obesity, Class III, BMI 40-49.9 (morbid obesity) (HCC)   Depression   Type 2 diabetes mellitus without complication (HCC)   Acute lower UTI   Constipation   Lumbar compression fracture, closed, initial encounter (HCC)   Closed compression fracture of L1 lumbar vertebra: continue on oxycodone, tramadol prn for pain. PT/OT recs SNF. Still awaiting insurance auth    Morbid obesity: BMI 40.3. Complicates overall care & prognosis    Depression: severity unknown. Continue on home dose of duloxetine    DM2: HbA1c 6.5, pretty well controlled. Continue on SSI w/ accuchecks    Leukocytosis: resolved    Constipation: responded to fleet enemas. Continue on miralax, lactulose    UTI: completed abx course. Urine cx showed insignificant growth    DVT prophylaxis: lovenox  Code Status: full  Family Communication:  Disposition Plan: d/c to SNF, waiting on insurance auth   Level of care: Med-Surg  Status is: Inpatient  Remains inpatient appropriate because: medically stable for d/c, waiting on insurance auth    Consultants:  Neuro surg   Procedures:   Antimicrobials:   Subjective: Pt c/o migraine   Objective: Vitals:   10/12/21 0731 10/12/21 1636 10/12/21 2033 10/13/21 0351  BP: 108/70 135/65 109/68 101/71  Pulse: 78 72 86 68  Resp: 14 15 20 20   Temp: 97.6 F (36.4 C) 98 F (36.7 C) 98.4 F (36.9 C) 97.6 F (36.4 C)  TempSrc: Oral Oral Oral Oral  SpO2: 97% 100% 96% 97%  Weight:      Height:        Intake/Output Summary (Last 24 hours) at 10/13/2021 0734 Last data filed at 10/12/2021 1847 Gross per 24 hour  Intake 720 ml  Output --  Net 720 ml   Filed  Weights   10/05/21 1558  Weight: 106.6 kg    Examination:  General exam: Appears uncomfortable   Respiratory system: clear breaths sounds b/l  Cardiovascular system: S1 & S2+. No rubs or clicks  Gastrointestinal system: Abd is soft, NT, ND & hypoactive bowel sounds   Central nervous system: alert and oriented. Moves all extremities  Psychiatry: judgement and insight appears normal. Flat mood and affect    Data Reviewed: I have personally reviewed following labs and imaging studies  CBC: Recent Labs  Lab 10/10/21 0309 10/12/21 0419 10/13/21 0513  WBC 7.9 8.3 8.7  HGB 11.6* 12.0 12.1  HCT 36.5 37.6 38.3  MCV 92.4 92.2 91.6  PLT 211 217 233   Basic Metabolic Panel: Recent Labs  Lab 10/10/21 0309 10/12/21 0419 10/13/21 0513  NA 137 139 137  K 4.0 4.3 4.2  CL 107 109 106  CO2 23 25 25   GLUCOSE 120* 114* 118*  BUN 20 15 18   CREATININE 0.52 0.73 0.67  CALCIUM 8.4* 8.9 8.8*   GFR: Estimated Creatinine Clearance: 98 mL/min (by C-G formula based on SCr of 0.67 mg/dL). Liver Function Tests: No results for input(s): AST, ALT, ALKPHOS, BILITOT, PROT, ALBUMIN in the last 168 hours.  No results for input(s): LIPASE, AMYLASE in the last 168 hours. No results for input(s): AMMONIA in the last 168 hours. Coagulation Profile: No results for input(s): INR, PROTIME in the last  168 hours.  Cardiac Enzymes: No results for input(s): CKTOTAL, CKMB, CKMBINDEX, TROPONINI in the last 168 hours. BNP (last 3 results) No results for input(s): PROBNP in the last 8760 hours. HbA1C: No results for input(s): HGBA1C in the last 72 hours. CBG: Recent Labs  Lab 10/11/21 2122 10/12/21 0731 10/12/21 1133 10/12/21 1635 10/12/21 2106  GLUCAP 182* 102* 144* 77 197*   Lipid Profile: No results for input(s): CHOL, HDL, LDLCALC, TRIG, CHOLHDL, LDLDIRECT in the last 72 hours. Thyroid Function Tests: No results for input(s): TSH, T4TOTAL, FREET4, T3FREE, THYROIDAB in the last 72  hours. Anemia Panel: No results for input(s): VITAMINB12, FOLATE, FERRITIN, TIBC, IRON, RETICCTPCT in the last 72 hours. Sepsis Labs: No results for input(s): PROCALCITON, LATICACIDVEN in the last 168 hours.  Recent Results (from the past 240 hour(s))  Resp Panel by RT-PCR (Flu A&B, Covid) Nasopharyngeal Swab     Status: None   Collection Time: 10/07/21  8:20 AM   Specimen: Nasopharyngeal Swab; Nasopharyngeal(NP) swabs in vial transport medium  Result Value Ref Range Status   SARS Coronavirus 2 by RT PCR NEGATIVE NEGATIVE Final    Comment: (NOTE) SARS-CoV-2 target nucleic acids are NOT DETECTED.  The SARS-CoV-2 RNA is generally detectable in upper respiratory specimens during the acute phase of infection. The lowest concentration of SARS-CoV-2 viral copies this assay can detect is 138 copies/mL. A negative result does not preclude SARS-Cov-2 infection and should not be used as the sole basis for treatment or other patient management decisions. A negative result may occur with  improper specimen collection/handling, submission of specimen other than nasopharyngeal swab, presence of viral mutation(s) within the areas targeted by this assay, and inadequate number of viral copies(<138 copies/mL). A negative result must be combined with clinical observations, patient history, and epidemiological information. The expected result is Negative.  Fact Sheet for Patients:  BloggerCourse.com  Fact Sheet for Healthcare Providers:  SeriousBroker.it  This test is no t yet approved or cleared by the [place] FDA and  has been authorized for detection and/or diagnosis of SARS-CoV-2 by FDA under an Emergency Use Authorization (EUA). This EUA will remain  in effect (meaning this test can be used) for the duration of the COVID-19 declaration under Section 564(b)(1) of the Act, 21 U.S.C.section 360bbb-3(b)(1), unless the authorization is  terminated  or revoked sooner.       Influenza A by PCR NEGATIVE NEGATIVE Final   Influenza B by PCR NEGATIVE NEGATIVE Final    Comment: (NOTE) The Xpert Xpress SARS-CoV-2/FLU/RSV plus assay is intended as an aid in the diagnosis of influenza from Nasopharyngeal swab specimens and should not be used as a sole basis for treatment. Nasal washings and aspirates are unacceptable for Xpert Xpress SARS-CoV-2/FLU/RSV testing.  Fact Sheet for Patients: BloggerCourse.com  Fact Sheet for Healthcare Providers: SeriousBroker.it  This test is not yet approved or cleared by the [place] FDA and has been authorized for detection and/or diagnosis of SARS-CoV-2 by FDA under an Emergency Use Authorization (EUA). This EUA will remain in effect (meaning this test can be used) for the duration of the COVID-19 declaration under Section 564(b)(1) of the Act, 21 U.S.C. section 360bbb-3(b)(1), unless the authorization is terminated or revoked.  Performed at [facility], [address], [place], [place] [address]   Urine Culture     Status: Abnormal   Collection Time: 10/08/21 10:23 AM   Specimen: Urine, Random  Result Value Ref Range Status   Specimen Description   Final  URINE, RANDOM Performed at [facility], [address], [place], [place] [address]    Special Requests   Final    NONE Performed at [facility], [address] [place], [place] [address]    Culture (A)  Final    <10,000 COLONIES/mL INSIGNIFICANT GROWTH Performed at [facility], [address], [place], [place] [address]    Report Status 10/09/2021 FINAL  Final         Radiology Studies: No results found.      Scheduled Meds:  acidophilus  1 capsule Oral Daily   amitriptyline  10 mg Oral QHS   DULoxetine  60 mg Oral QHS   enoxaparin (LOVENOX) injection  0.5 mg/kg Subcutaneous Q24H   insulin aspart  0-15 Units  Subcutaneous TID WC   insulin aspart  0-5 Units Subcutaneous QHS   lactulose  10 g Oral Daily   lidocaine  1 patch Transdermal Q24H   polyethylene glycol  17 g Oral Daily   topiramate  100 mg Oral QHS   Continuous Infusions:     LOS: 5 days    Time spent: 15 mins     Charise Killian, MD [facility] Pager [phone]  If 7PM-7AM, please contact night-coverage 10/13/2021, 7:34 AM

## 2021-10-14 LAB — CBC
HCT: 40.4 % (ref 36.0–46.0)
Hemoglobin: 12.8 g/dL (ref 12.0–15.0)
MCH: 28.9 pg (ref 26.0–34.0)
MCHC: 31.7 g/dL (ref 30.0–36.0)
MCV: 91.2 fL (ref 80.0–100.0)
Platelets: 256 10*3/uL (ref 150–400)
RBC: 4.43 MIL/uL (ref 3.87–5.11)
RDW: 12.4 % (ref 11.5–15.5)
WBC: 9.8 10*3/uL (ref 4.0–10.5)
nRBC: 0 % (ref 0.0–0.2)

## 2021-10-14 LAB — BASIC METABOLIC PANEL
Anion gap: 7 (ref 5–15)
BUN: 19 mg/dL (ref 6–20)
CO2: 25 mmol/L (ref 22–32)
Calcium: 8.8 mg/dL — ABNORMAL LOW (ref 8.9–10.3)
Chloride: 104 mmol/L (ref 98–111)
Creatinine, Ser: 0.71 mg/dL (ref 0.44–1.00)
GFR, Estimated: >60 mL/min (ref >60)
Glucose, Bld: 128 mg/dL — ABNORMAL HIGH (ref 70–99)
Potassium: 4.1 mmol/L (ref 3.5–5.1)
Sodium: 136 mmol/L (ref 135–145)

## 2021-10-14 LAB — GLUCOSE, CAPILLARY
Glucose-Capillary: 102 mg/dL — ABNORMAL HIGH (ref 70–99)
Glucose-Capillary: 108 mg/dL — ABNORMAL HIGH (ref 70–99)
Glucose-Capillary: 125 mg/dL — ABNORMAL HIGH (ref 70–99)
Glucose-Capillary: 95 mg/dL (ref 70–99)

## 2021-10-14 NOTE — Progress Notes (Signed)
PROGRESS NOTE    Rebekah Simon  [id number]:[id number] DOB: 1971/06/13 DOA: 10/05/2021 PCP: Patient, No Pcp Per (Inactive)    Assessment & Plan:   Principal Problem:   Closed compression fracture of L1 lumbar vertebra, initial encounter [facility]) Active Problems:   At risk for inadequate pain control   Obesity, Class III, BMI 40-49.9 (morbid obesity) (HCC)   Depression   Type 2 diabetes mellitus without complication (HCC)   Acute lower UTI   Constipation   Lumbar compression fracture, closed, initial encounter (HCC)   Closed compression fracture of L1 lumbar vertebra: continue on oxycodone, tramadol prn for pain. PT/OT recs SNF. Awaiting insurance auth still    Morbid obesity: BMI 40.3. Complicates overall care & prognosis    Depression: severity unknown. Continue on home dose of duloxetine    DM2: well controlled, HbA1c 6.5. Continue on SSI w/ accuchecks    Leukocytosis: resolved    Constipation: responded to fleet enemas. Continue on lactulose, miralax    UTI: completed abx course. Urine cx showed insignificant growth    DVT prophylaxis: lovenox  Code Status: full  Family Communication:  Disposition Plan: d/c to SNF, waiting on insurance auth   Level of care: Med-Surg  Status is: Inpatient  Remains inpatient appropriate because: medically stable for d/c, waiting on insurance auth    Consultants:  Neuro surg   Procedures:   Antimicrobials:   Subjective: Pt c/o fatigue   Objective: Vitals:   10/13/21 0927 10/13/21 1644 10/13/21 2032 10/14/21 0711  BP: 107/62 101/67 105/71 101/73  Pulse: 80 74 82 71  Resp: 20 16 19 17   Temp: 97.8 F (36.6 C) 98.3 F (36.8 C) (!) 97.5 F (36.4 C) 98.6 F (37 C)  TempSrc: Oral Oral Oral Oral  SpO2: 94% 94% 97% 96%  Weight:      Height:        Intake/Output Summary (Last 24 hours) at 10/14/2021 0735 Last data filed at 10/13/2021 1850 Gross per 24 hour  Intake 720 ml  Output 600 ml  Net 120 ml   Filed Weights    10/05/21 1558  Weight: 106.6 kg    Examination:  General exam: Appears calm & comfortable  Respiratory system: clear breath sounds b/l  Cardiovascular system: S1/S2+. No rubs or clicks  Gastrointestinal system: Abd is soft, NT, ND & normal bowel sounds  Central nervous system: alert and oriented. Moves all extremities  Psychiatry: judgement and insight appears normal. Flat mood and affect     Data Reviewed: I have personally reviewed following labs and imaging studies  CBC: Recent Labs  Lab 10/10/21 0309 10/12/21 0419 10/13/21 0513 10/14/21 0415  WBC 7.9 8.3 8.7 9.8  HGB 11.6* 12.0 12.1 12.8  HCT 36.5 37.6 38.3 40.4  MCV 92.4 92.2 91.6 91.2  PLT 211 217 233 256   Basic Metabolic Panel: Recent Labs  Lab 10/10/21 0309 10/12/21 0419 10/13/21 0513 10/14/21 0415  NA 137 139 137 136  K 4.0 4.3 4.2 4.1  CL 107 109 106 104  CO2 23 25 25 25   GLUCOSE 120* 114* 118* 128*  BUN 20 15 18 19   CREATININE 0.52 0.73 0.67 0.71  CALCIUM 8.4* 8.9 8.8* 8.8*   GFR: Estimated Creatinine Clearance: 98 mL/min (by C-G formula based on SCr of 0.71 mg/dL). Liver Function Tests: No results for input(s): AST, ALT, ALKPHOS, BILITOT, PROT, ALBUMIN in the last 168 hours.  No results for input(s): LIPASE, AMYLASE in the last 168 hours. No results for input(s):  AMMONIA in the last 168 hours. Coagulation Profile: No results for input(s): INR, PROTIME in the last 168 hours.  Cardiac Enzymes: No results for input(s): CKTOTAL, CKMB, CKMBINDEX, TROPONINI in the last 168 hours. BNP (last 3 results) No results for input(s): PROBNP in the last 8760 hours. HbA1C: No results for input(s): HGBA1C in the last 72 hours. CBG: Recent Labs  Lab 10/12/21 2106 10/13/21 0922 10/13/21 1144 10/13/21 1641 10/13/21 2039  GLUCAP 197* 216* 78 114* 156*   Lipid Profile: No results for input(s): CHOL, HDL, LDLCALC, TRIG, CHOLHDL, LDLDIRECT in the last 72 hours. Thyroid Function Tests: No results for  input(s): TSH, T4TOTAL, FREET4, T3FREE, THYROIDAB in the last 72 hours. Anemia Panel: No results for input(s): VITAMINB12, FOLATE, FERRITIN, TIBC, IRON, RETICCTPCT in the last 72 hours. Sepsis Labs: No results for input(s): PROCALCITON, LATICACIDVEN in the last 168 hours.  Recent Results (from the past 240 hour(s))  Resp Panel by RT-PCR (Flu A&B, Covid) Nasopharyngeal Swab     Status: None   Collection Time: 10/07/21  8:20 AM   Specimen: Nasopharyngeal Swab; Nasopharyngeal(NP) swabs in vial transport medium  Result Value Ref Range Status   SARS Coronavirus 2 by RT PCR NEGATIVE NEGATIVE Final    Comment: (NOTE) SARS-CoV-2 target nucleic acids are NOT DETECTED.  The SARS-CoV-2 RNA is generally detectable in upper respiratory specimens during the acute phase of infection. The lowest concentration of SARS-CoV-2 viral copies this assay can detect is 138 copies/mL. A negative result does not preclude SARS-Cov-2 infection and should not be used as the sole basis for treatment or other patient management decisions. A negative result may occur with  improper specimen collection/handling, submission of specimen other than nasopharyngeal swab, presence of viral mutation(s) within the areas targeted by this assay, and inadequate number of viral copies(<138 copies/mL). A negative result must be combined with clinical observations, patient history, and epidemiological information. The expected result is Negative.  Fact Sheet for Patients:  BloggerCourse.com  Fact Sheet for Healthcare Providers:  SeriousBroker.it  This test is no t yet approved or cleared by the [place] FDA and  has been authorized for detection and/or diagnosis of SARS-CoV-2 by FDA under an Emergency Use Authorization (EUA). This EUA will remain  in effect (meaning this test can be used) for the duration of the COVID-19 declaration under Section 564(b)(1) of the Act,  21 U.S.C.section 360bbb-3(b)(1), unless the authorization is terminated  or revoked sooner.       Influenza A by PCR NEGATIVE NEGATIVE Final   Influenza B by PCR NEGATIVE NEGATIVE Final    Comment: (NOTE) The Xpert Xpress SARS-CoV-2/FLU/RSV plus assay is intended as an aid in the diagnosis of influenza from Nasopharyngeal swab specimens and should not be used as a sole basis for treatment. Nasal washings and aspirates are unacceptable for Xpert Xpress SARS-CoV-2/FLU/RSV testing.  Fact Sheet for Patients: BloggerCourse.com  Fact Sheet for Healthcare Providers: SeriousBroker.it  This test is not yet approved or cleared by the [place] FDA and has been authorized for detection and/or diagnosis of SARS-CoV-2 by FDA under an Emergency Use Authorization (EUA). This EUA will remain in effect (meaning this test can be used) for the duration of the COVID-19 declaration under Section 564(b)(1) of the Act, 21 U.S.C. section 360bbb-3(b)(1), unless the authorization is terminated or revoked.  Performed at [facility], [address] [place], [place] [address]   Urine Culture     Status: Abnormal   Collection Time: 10/08/21 10:23 AM  Specimen: Urine, Random  Result Value Ref Range Status   Specimen Description   Final    URINE, RANDOM Performed at [facility], [address], [place], [place] [address]    Special Requests   Final    NONE Performed at [facility], [address] [place], [place] [address]    Culture (A)  Final    <10,000 COLONIES/mL INSIGNIFICANT GROWTH Performed at [facility], [address], [place], [place] [address]    Report Status 10/09/2021 FINAL  Final         Radiology Studies: No results found.      Scheduled Meds:  acidophilus  1 capsule Oral Daily   amitriptyline  10 mg Oral QHS   DULoxetine  60 mg Oral QHS   enoxaparin (LOVENOX) injection   0.5 mg/kg Subcutaneous Q24H   insulin aspart  0-15 Units Subcutaneous TID WC   insulin aspart  0-5 Units Subcutaneous QHS   lactulose  20 g Oral Daily   lidocaine  1 patch Transdermal Q24H   polyethylene glycol  17 g Oral Daily   topiramate  100 mg Oral QHS   Continuous Infusions:     LOS: 6 days    Time spent: 15 mins     Charise Killian, MD [facility] Pager [phone]  If 7PM-7AM, please contact night-coverage 10/14/2021, 7:35 AM
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
## 2021-10-14 NOTE — TOC Progression Note (Signed)
Transition of Care [facility]) - Progression Note    Patient Details  Name: Rebekah Simon MRN: [id number] Date of Birth: 1971/10/01  Transition of Care [facility]) CM/SW Contact  Bing Quarry, RN Phone Number: 10/14/2021, 3:56 PM  Clinical Narrative:  Patient recently moved from [place] to [place]. PCP is Amy Sports administrator from [facility] in [place]. [phone].  [place] Medicaid and employers ins [employer]. She will be unable to obtain [facility] agency at this time. Updated provider. Gabriel Cirri RN CM     Expected Discharge Plan: Skilled Nursing Facility Barriers to Discharge: Continued Medical Work up  Expected Discharge Plan and Services Expected Discharge Plan: Skilled Nursing Facility   Discharge Planning Services: CM Consult   Living arrangements for the past 2 months: Mobile Home                                       Social Determinants of Health (SDOH) Interventions    Readmission Risk Interventions No flowsheet data found.

## 2021-10-15 LAB — CBC
HCT: 41.2 % (ref 36.0–46.0)
Hemoglobin: 13.1 g/dL (ref 12.0–15.0)
MCH: 28.9 pg (ref 26.0–34.0)
MCHC: 31.8 g/dL (ref 30.0–36.0)
MCV: 90.7 fL (ref 80.0–100.0)
Platelets: 270 10*3/uL (ref 150–400)
RBC: 4.54 MIL/uL (ref 3.87–5.11)
RDW: 12.6 % (ref 11.5–15.5)
WBC: 9.1 10*3/uL (ref 4.0–10.5)
nRBC: 0 % (ref 0.0–0.2)

## 2021-10-15 LAB — GLUCOSE, CAPILLARY
Glucose-Capillary: 114 mg/dL — ABNORMAL HIGH (ref 70–99)
Glucose-Capillary: 87 mg/dL (ref 70–99)
Glucose-Capillary: 146 mg/dL — ABNORMAL HIGH (ref 70–99)
Glucose-Capillary: 124 mg/dL — ABNORMAL HIGH (ref 70–99)

## 2021-10-15 LAB — BASIC METABOLIC PANEL
Anion gap: 8 (ref 5–15)
BUN: 18 mg/dL (ref 6–20)
CO2: 25 mmol/L (ref 22–32)
Calcium: 8.8 mg/dL — ABNORMAL LOW (ref 8.9–10.3)
Chloride: 104 mmol/L (ref 98–111)
Creatinine, Ser: 0.66 mg/dL (ref 0.44–1.00)
GFR, Estimated: >60 mL/min (ref >60)
Glucose, Bld: 126 mg/dL — ABNORMAL HIGH (ref 70–99)
Potassium: 4.2 mmol/L (ref 3.5–5.1)
Sodium: 137 mmol/L (ref 135–145)

## 2021-10-15 NOTE — Progress Notes (Signed)
PROGRESS NOTE    Rebekah Simon  EVO:[id number] DOB: [birth year] DOA: 10/05/2021 PCP: Patient, No Pcp Per (Inactive)    Assessment & Plan:   Principal Problem:   Closed compression fracture of L1 lumbar vertebra, initial encounter [facility]) Active Problems:   At risk for inadequate pain control   Obesity, Class III, BMI 40-49.9 (morbid obesity) (HCC)   Depression   Type 2 diabetes mellitus without complication (HCC)   Acute lower UTI   Constipation   Lumbar compression fracture, closed, initial encounter (HCC)   Closed compression fracture of L1 lumbar vertebra: continue on oxycodone, tramadol prn for pain. PT/OT now recs home health    Morbid obesity: BMI 40.3. Complicates overall care & prognosis     Depression: severity unknown. Continue on home dose of duloxetine    DM2: HbA1c 6.5, well controlled. Continue on SSI w/ accuchecks    Leukocytosis: resolved    Constipation: responded to fleet enemas. Continue on miralax, lactulose   UTI: completed abx course. Urine cx showed insignificant growth    DVT prophylaxis: lovenox  Code Status: full  Family Communication:  Disposition Plan:  d/c home w/ HH vs outpatient therapy   Level of care: Med-Surg  Status is: Inpatient  Remains inpatient appropriate because: will d/c tomorrow w/ home health vs outpatient therapy    Consultants:  Neuro surg   Procedures:   Antimicrobials:   Subjective: Pt c/o malaise  Objective: Vitals:   10/14/21 1532 10/14/21 2123 10/15/21 0446 10/15/21 0732  BP: 106/70 113/72 108/70 107/66  Pulse: 74 76 71 74  Resp: 16 16 16 18   Temp: 98.1 F (36.7 C) 97.6 F (36.4 C) 97.9 F (36.6 C) 97.7 F (36.5 C)  TempSrc: Oral Oral Oral Oral  SpO2: 97% 100% 100% 96%  Weight:      Height:        Intake/Output Summary (Last 24 hours) at 10/15/2021 0756 Last data filed at 10/14/2021 0912 Gross per 24 hour  Intake 120 ml  Output 0 ml  Net 120 ml   Filed Weights   10/05/21 1558   Weight: 106.6 kg    Examination:  General exam: Appears comfortable  Respiratory system: clear breath sounds b/l  Cardiovascular system: S1 & S2+. No rubs or clicks  Gastrointestinal system: abd is soft, NT, ND & normal bowel sounds   Central nervous system: alert and oriented. Moves all extremities  Psychiatry: judgement and insight appears normal. Flat mood and affect     Data Reviewed: I have personally reviewed following labs and imaging studies  CBC: Recent Labs  Lab 10/10/21 0309 10/12/21 0419 10/13/21 0513 10/14/21 0415 10/15/21 0417  WBC 7.9 8.3 8.7 9.8 9.1  HGB 11.6* 12.0 12.1 12.8 13.1  HCT 36.5 37.6 38.3 40.4 41.2  MCV 92.4 92.2 91.6 91.2 90.7  PLT 211 217 233 256 270   Basic Metabolic Panel: Recent Labs  Lab 10/10/21 0309 10/12/21 0419 10/13/21 0513 10/14/21 0415 10/15/21 0417  NA 137 139 137 136 137  K 4.0 4.3 4.2 4.1 4.2  CL 107 109 106 104 104  CO2 23 25 25 25 25   GLUCOSE 120* 114* 118* 128* 126*  BUN 20 15 18 19 18   CREATININE 0.52 0.73 0.67 0.71 0.66  CALCIUM 8.4* 8.9 8.8* 8.8* 8.8*   GFR: Estimated Creatinine Clearance: 98 mL/min (by C-G formula based on SCr of 0.66 mg/dL). Liver Function Tests: No results for input(s): AST, ALT, ALKPHOS, BILITOT, PROT, ALBUMIN in the last 168 hours.  No results for input(s): LIPASE, AMYLASE in the last 168 hours. No results for input(s): AMMONIA in the last 168 hours. Coagulation Profile: No results for input(s): INR, PROTIME in the last 168 hours.  Cardiac Enzymes: No results for input(s): CKTOTAL, CKMB, CKMBINDEX, TROPONINI in the last 168 hours. BNP (last 3 results) No results for input(s): PROBNP in the last 8760 hours. HbA1C: No results for input(s): HGBA1C in the last 72 hours. CBG: Recent Labs  Lab 10/13/21 2039 10/14/21 0746 10/14/21 1205 10/14/21 1649 10/14/21 2127  GLUCAP 156* 102* 108* 125* 95   Lipid Profile: No results for input(s): CHOL, HDL, LDLCALC, TRIG, CHOLHDL, LDLDIRECT  in the last 72 hours. Thyroid Function Tests: No results for input(s): TSH, T4TOTAL, FREET4, T3FREE, THYROIDAB in the last 72 hours. Anemia Panel: No results for input(s): VITAMINB12, FOLATE, FERRITIN, TIBC, IRON, RETICCTPCT in the last 72 hours. Sepsis Labs: No results for input(s): PROCALCITON, LATICACIDVEN in the last 168 hours.  Recent Results (from the past 240 hour(s))  Resp Panel by RT-PCR (Flu A&B, Covid) Nasopharyngeal Swab     Status: None   Collection Time: 10/07/21  8:20 AM   Specimen: Nasopharyngeal Swab; Nasopharyngeal(NP) swabs in vial transport medium  Result Value Ref Range Status   SARS Coronavirus 2 by RT PCR NEGATIVE NEGATIVE Final    Comment: (NOTE) SARS-CoV-2 target nucleic acids are NOT DETECTED.  The SARS-CoV-2 RNA is generally detectable in upper respiratory specimens during the acute phase of infection. The lowest concentration of SARS-CoV-2 viral copies this assay can detect is 138 copies/mL. A negative result does not preclude SARS-Cov-2 infection and should not be used as the sole basis for treatment or other patient management decisions. A negative result may occur with  improper specimen collection/handling, submission of specimen other than nasopharyngeal swab, presence of viral mutation(s) within the areas targeted by this assay, and inadequate number of viral copies(<138 copies/mL). A negative result must be combined with clinical observations, patient history, and epidemiological information. The expected result is Negative.  Fact Sheet for Patients:  BloggerCourse.com  Fact Sheet for Healthcare Providers:  SeriousBroker.it  This test is no t yet approved or cleared by the [place] FDA and  has been authorized for detection and/or diagnosis of SARS-CoV-2 by FDA under an Emergency Use Authorization (EUA). This EUA will remain  in effect (meaning this test can be used) for the duration of  the COVID-19 declaration under Section 564(b)(1) of the Act, 21 U.S.C.section 360bbb-3(b)(1), unless the authorization is terminated  or revoked sooner.       Influenza A by PCR NEGATIVE NEGATIVE Final   Influenza B by PCR NEGATIVE NEGATIVE Final    Comment: (NOTE) The Xpert Xpress SARS-CoV-2/FLU/RSV plus assay is intended as an aid in the diagnosis of influenza from Nasopharyngeal swab specimens and should not be used as a sole basis for treatment. Nasal washings and aspirates are unacceptable for Xpert Xpress SARS-CoV-2/FLU/RSV testing.  Fact Sheet for Patients: BloggerCourse.com  Fact Sheet for Healthcare Providers: SeriousBroker.it  This test is not yet approved or cleared by the [place] FDA and has been authorized for detection and/or diagnosis of SARS-CoV-2 by FDA under an Emergency Use Authorization (EUA). This EUA will remain in effect (meaning this test can be used) for the duration of the COVID-19 declaration under Section 564(b)(1) of the Act, 21 U.S.C. section 360bbb-3(b)(1), unless the authorization is terminated or revoked.  Performed at [facility], [address] [place], [place] [address]   Urine Culture  Status: Abnormal   Collection Time: 10/08/21 10:23 AM   Specimen: Urine, Random  Result Value Ref Range Status   Specimen Description   Final    URINE, RANDOM Performed at [facility], [address], [place], [place] [address]    Special Requests   Final    NONE Performed at [facility], [address] [place], [place] [address]    Culture (A)  Final    <10,000 COLONIES/mL INSIGNIFICANT GROWTH Performed at [facility], [address], [place], [place] [address]    Report Status 10/09/2021 FINAL  Final         Radiology Studies: No results found.      Scheduled Meds:  acidophilus  1 capsule Oral Daily   amitriptyline  10 mg Oral QHS    DULoxetine  60 mg Oral QHS   enoxaparin (LOVENOX) injection  0.5 mg/kg Subcutaneous Q24H   insulin aspart  0-15 Units Subcutaneous TID WC   insulin aspart  0-5 Units Subcutaneous QHS   lactulose  20 g Oral Daily   lidocaine  1 patch Transdermal Q24H   polyethylene glycol  17 g Oral Daily   topiramate  100 mg Oral QHS   Continuous Infusions:     LOS: 7 days    Time spent: 15 mins     Charise Killian, MD [facility] Pager [phone]  If 7PM-7AM, please contact night-coverage 10/15/2021, 7:56 AM

## 2021-10-15 NOTE — Plan of Care (Signed)
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
## 2021-10-16 LAB — BASIC METABOLIC PANEL
Anion gap: 7 (ref 5–15)
BUN: 19 mg/dL (ref 6–20)
CO2: 27 mmol/L (ref 22–32)
Calcium: 9.1 mg/dL (ref 8.9–10.3)
Chloride: 103 mmol/L (ref 98–111)
Creatinine, Ser: 0.64 mg/dL (ref 0.44–1.00)
GFR, Estimated: >60 mL/min (ref >60)
Glucose, Bld: 122 mg/dL — ABNORMAL HIGH (ref 70–99)
Potassium: 3.8 mmol/L (ref 3.5–5.1)
Sodium: 137 mmol/L (ref 135–145)

## 2021-10-16 LAB — GLUCOSE, CAPILLARY
Glucose-Capillary: 105 mg/dL — ABNORMAL HIGH (ref 70–99)
Glucose-Capillary: 101 mg/dL — ABNORMAL HIGH (ref 70–99)
Glucose-Capillary: 130 mg/dL — ABNORMAL HIGH (ref 70–99)

## 2021-10-16 LAB — CBC
HCT: 42 % (ref 36.0–46.0)
Hemoglobin: 13.3 g/dL (ref 12.0–15.0)
MCH: 28.8 pg (ref 26.0–34.0)
MCHC: 31.7 g/dL (ref 30.0–36.0)
MCV: 90.9 fL (ref 80.0–100.0)
Platelets: 271 10*3/uL (ref 150–400)
RBC: 4.62 MIL/uL (ref 3.87–5.11)
RDW: 12.4 % (ref 11.5–15.5)
WBC: 9 10*3/uL (ref 4.0–10.5)
nRBC: 0 % (ref 0.0–0.2)

## 2021-10-16 MED ORDER — TRAMADOL HCL 50 MG PO TABS: 100.0000 mg | ORAL_TABLET | Freq: Four times a day (QID) | ORAL | 0 refills | Status: DC | PRN

## 2021-10-16 MED ORDER — TRAMADOL HCL 50 MG PO TABS
100.0000 mg | ORAL_TABLET | Freq: Four times a day (QID) | ORAL | 0 refills | Status: AC | PRN
Start: 2021-10-16 — End: 2021-10-19
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
## 2021-10-16 NOTE — TOC Transition Note (Signed)
Transition of Care [facility]) - CM/SW Discharge Note   Patient Details  Name: [name] MRN: [id number] Date of Birth: [birth year]  Transition of Care [facility]) CM/SW Contact:  Margarito Liner, LCSW Phone Number: 10/16/2021, 12:57 PM   Clinical Narrative: Unable to find a home health agency to accept patient. Was told patient would need to have her primary care provider in [place]. Patient is aware. Encouraged her to reach out to her current PCP about outpatient therapy. Walker and 3-in-1 are already in the room. No further concerns. CSW signing off.    Final next level of care: Home/Self Care Barriers to Discharge: Barriers Resolved   Patient Goals and CMS Choice     Choice offered to / list presented to : Patient  Discharge Placement                Patient to be transferred to facility by: Daughter will pick her up after work   Patient and family notified of of transfer: 10/16/21  Discharge Plan and Services   Discharge Planning Services: CM Consult                                 Social Determinants of Health (SDOH) Interventions     Readmission Risk Interventions No flowsheet data found.
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
## 2021-12-07 ENCOUNTER — Other Ambulatory Visit: Payer: Self-pay | Admitting: Neurosurgery

## 2021-12-07 DIAGNOSIS — S32019D Unspecified fracture of first lumbar vertebra, subsequent encounter for fracture with routine healing: Secondary | ICD-10-CM

## 2021-12-18 ENCOUNTER — Ambulatory Visit
Admission: RE | Admit: 2021-12-18 | Discharge: 2021-12-18 | Disposition: A | Discharge status: Home / Self Care | Payer: No Typology Code available for payment source | Source: Ambulatory Visit | Attending: Neurosurgery | Admitting: Neurosurgery

## 2021-12-18 DIAGNOSIS — S32019D Unspecified fracture of first lumbar vertebra, subsequent encounter for fracture with routine healing: Secondary | ICD-10-CM

## 2021-12-22 ENCOUNTER — Other Ambulatory Visit: Payer: Self-pay | Admitting: Neurosurgery

## 2021-12-22 DIAGNOSIS — S32019D Unspecified fracture of first lumbar vertebra, subsequent encounter for fracture with routine healing: Secondary | ICD-10-CM

## 2021-12-26 ENCOUNTER — Ambulatory Visit
Admission: RE | Admit: 2021-12-26 | Payer: No Typology Code available for payment source | Source: Ambulatory Visit | Admitting: Radiology

## 2021-12-27 ENCOUNTER — Other Ambulatory Visit: Payer: Self-pay

## 2021-12-27 ENCOUNTER — Ambulatory Visit
Admission: RE | Admit: 2021-12-27 | Discharge: 2021-12-27 | Disposition: A | Discharge status: Home / Self Care | Payer: No Typology Code available for payment source | Source: Ambulatory Visit | Attending: Neurosurgery | Admitting: Neurosurgery

## 2021-12-27 DIAGNOSIS — S32019D Unspecified fracture of first lumbar vertebra, subsequent encounter for fracture with routine healing: Secondary | ICD-10-CM

## 2021-12-27 HISTORY — PX: IR RADIOLOGIST EVAL & MGMT: IMG5224
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
## 2021-12-28 ENCOUNTER — Other Ambulatory Visit: Payer: Self-pay | Admitting: Interventional Radiology

## 2021-12-28 DIAGNOSIS — S32010A Wedge compression fracture of first lumbar vertebra, initial encounter for closed fracture: Secondary | ICD-10-CM

## 2022-01-04 ENCOUNTER — Other Ambulatory Visit: Payer: Self-pay | Admitting: Internal Medicine

## 2022-01-05 ENCOUNTER — Ambulatory Visit
Admission: RE | Admit: 2022-01-05 | Discharge: 2022-01-05 | Disposition: A | Discharge status: Home / Self Care | Payer: No Typology Code available for payment source | Source: Ambulatory Visit | Attending: Interventional Radiology | Admitting: Interventional Radiology

## 2022-01-05 ENCOUNTER — Other Ambulatory Visit: Payer: Self-pay

## 2022-01-05 DIAGNOSIS — M47816 Spondylosis without myelopathy or radiculopathy, lumbar region: Secondary | ICD-10-CM | POA: Diagnosis present

## 2022-01-05 DIAGNOSIS — Z9884 Bariatric surgery status: Secondary | ICD-10-CM | POA: Diagnosis not present

## 2022-01-05 DIAGNOSIS — S32010A Wedge compression fracture of first lumbar vertebra, initial encounter for closed fracture: Secondary | ICD-10-CM | POA: Insufficient documentation

## 2022-01-05 DIAGNOSIS — Z6839 Body mass index (BMI) 39.0-39.9, adult: Secondary | ICD-10-CM | POA: Insufficient documentation

## 2022-01-05 HISTORY — PX: IR KYPHO LUMBAR INC FX REDUCE BONE BX UNI/BIL CANNULATION INC/IMAGING: IMG5519

## 2022-01-05 LAB — CBC WITH DIFFERENTIAL/PLATELET
Abs Immature Granulocytes: 0.02 10*3/uL (ref 0.00–0.07)
Basophils Absolute: 0.1 10*3/uL (ref 0.0–0.1)
Basophils Relative: 1 %
Eosinophils Absolute: 0.2 10*3/uL (ref 0.0–0.5)
Eosinophils Relative: 2 %
HCT: 42.7 % (ref 36.0–46.0)
Hemoglobin: 14 g/dL (ref 12.0–15.0)
Immature Granulocytes: 0 %
Lymphocytes Relative: 44 %
Lymphs Abs: 3.7 10*3/uL (ref 0.7–4.0)
MCH: 28.8 pg (ref 26.0–34.0)
MCHC: 32.8 g/dL (ref 30.0–36.0)
MCV: 87.9 fL (ref 80.0–100.0)
Monocytes Absolute: 0.4 10*3/uL (ref 0.1–1.0)
Monocytes Relative: 5 %
Neutro Abs: 4.1 10*3/uL (ref 1.7–7.7)
Neutrophils Relative %: 48 %
Platelets: 261 10*3/uL (ref 150–400)
RBC: 4.86 MIL/uL (ref 3.87–5.11)
RDW: 13.1 % (ref 11.5–15.5)
WBC: 8.5 10*3/uL (ref 4.0–10.5)
nRBC: 0 % (ref 0.0–0.2)

## 2022-01-05 LAB — BASIC METABOLIC PANEL
Anion gap: 7 (ref 5–15)
BUN: 18 mg/dL (ref 6–20)
CO2: 23 mmol/L (ref 22–32)
Calcium: 9.3 mg/dL (ref 8.9–10.3)
Chloride: 107 mmol/L (ref 98–111)
Creatinine, Ser: 0.79 mg/dL (ref 0.44–1.00)
GFR, Estimated: >60 mL/min (ref >60)
Glucose, Bld: 143 mg/dL — ABNORMAL HIGH (ref 70–99)
Potassium: 4.2 mmol/L (ref 3.5–5.1)
Sodium: 137 mmol/L (ref 135–145)

## 2022-01-05 LAB — GLUCOSE, CAPILLARY
Glucose-Capillary: 139 mg/dL — ABNORMAL HIGH (ref 70–99)
Glucose-Capillary: 132 mg/dL — ABNORMAL HIGH (ref 70–99)

## 2022-01-05 LAB — PROTIME-INR
INR: 1 (ref 0.8–1.2)
Prothrombin Time: 12.9 seconds (ref 11.4–15.2)

## 2022-01-05 MED ORDER — OXYCODONE HCL 5 MG PO TABS
ORAL_TABLET | ORAL | Status: AC
  Filled 2022-01-05: qty 1

## 2022-01-05 MED ORDER — SODIUM CHLORIDE 0.9 % IV SOLN
INTRAVENOUS | Status: DC
  Filled 2022-01-05: qty 1000

## 2022-01-05 MED ORDER — MIDAZOLAM HCL 5 MG/5ML IJ SOLN
INTRAMUSCULAR | Status: AC | PRN
  Administered 2022-01-05: 1 mg via INTRAVENOUS

## 2022-01-05 MED ORDER — MIDAZOLAM HCL 2 MG/2ML IJ SOLN
INTRAMUSCULAR | Status: AC | PRN
  Administered 2022-01-05 (×3): 1 mg via INTRAVENOUS

## 2022-01-05 MED ORDER — FENTANYL CITRATE (PF) 100 MCG/2ML IJ SOLN
INTRAMUSCULAR | Status: AC | PRN
Start: 2022-01-05 — End: 2022-01-05
  Administered 2022-01-05: 25 ug via INTRAVENOUS
  Administered 2022-01-05 (×3): 50 ug via INTRAVENOUS

## 2022-01-05 MED ORDER — OXYCODONE HCL 5 MG PO TABS
5.0000 mg | ORAL_TABLET | ORAL | Status: AC
  Administered 2022-01-05: 5 mg via ORAL
  Filled 2022-01-05: qty 1

## 2022-01-05 MED ORDER — CEFAZOLIN SODIUM-DEXTROSE 2-4 GM/100ML-% IV SOLN
2.0000 g | INTRAVENOUS | Status: DC
  Filled 2022-01-05: qty 100

## 2022-01-05 MED ORDER — CEFAZOLIN SODIUM-DEXTROSE 1-4 GM/50ML-% IV SOLN
INTRAVENOUS | Status: AC | PRN
  Administered 2022-01-05: 2 g via INTRAVENOUS

## 2022-01-05 NOTE — Procedures (Signed)
Interventional Radiology Procedure Note  Date of Procedure: 01/05/2022  Procedure: L1 kyphoplasty   Findings:  1. Successful L1 kyphoplasty    Complications: No immediate complications noted.   Estimated Blood Loss: minimal  Follow-up and Recommendations: 1. Bedrest 2 hours    Olive Bass, MD  Vascular & Interventional Radiology  01/05/2022 11:09 AM
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
## 2022-01-05 NOTE — Progress Notes (Signed)
Pt. Med. With Oxycodone 5 mg IR p.o. for c/o lower back pain "7" on scale 1-10 post kyphoplasty.

## 2022-01-05 NOTE — Progress Notes (Signed)
Pt. States low back pain now is a "5", constant aching , but "I can deal with it." States "it is better than what I came in with today." PA came by to give pt. Work excuse for 1-2 weeks per pt. Request.. Pt. Stable for DC home with mother [place].

## 2022-01-08 ENCOUNTER — Other Ambulatory Visit: Payer: Self-pay | Admitting: Interventional Radiology

## 2022-01-10 ENCOUNTER — Other Ambulatory Visit: Payer: Self-pay | Admitting: Interventional Radiology

## 2022-01-10 DIAGNOSIS — S32010A Wedge compression fracture of first lumbar vertebra, initial encounter for closed fracture: Secondary | ICD-10-CM

## 2022-01-18 ENCOUNTER — Ambulatory Visit
Admission: RE | Admit: 2022-01-18 | Discharge: 2022-01-18 | Disposition: A | Discharge status: Home / Self Care | Payer: No Typology Code available for payment source | Source: Ambulatory Visit | Attending: Interventional Radiology | Admitting: Interventional Radiology

## 2022-01-18 ENCOUNTER — Other Ambulatory Visit: Payer: Self-pay

## 2022-01-18 DIAGNOSIS — S32010A Wedge compression fracture of first lumbar vertebra, initial encounter for closed fracture: Secondary | ICD-10-CM

## 2022-01-18 HISTORY — PX: IR RADIOLOGIST EVAL & MGMT: IMG5224
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
## 2022-07-30 ENCOUNTER — Encounter: Payer: Self-pay | Admitting: *Deleted

## 2022-11-11 IMAGING — XA IR KYPHO VERTEBRAL LUMBAR AUGMENTATION
1 series · 13 of 16 positions shown · non-contrast
Comparison: Multiple priors

INDICATION: L1 compression fracture with progressive height loss

EXAM:
L1 vertebral body augmentation and kyphoplasty using fluoroscopic
guidance

[Series 5: interv standard · 13 of 16 slices shown]
[im 1/16]
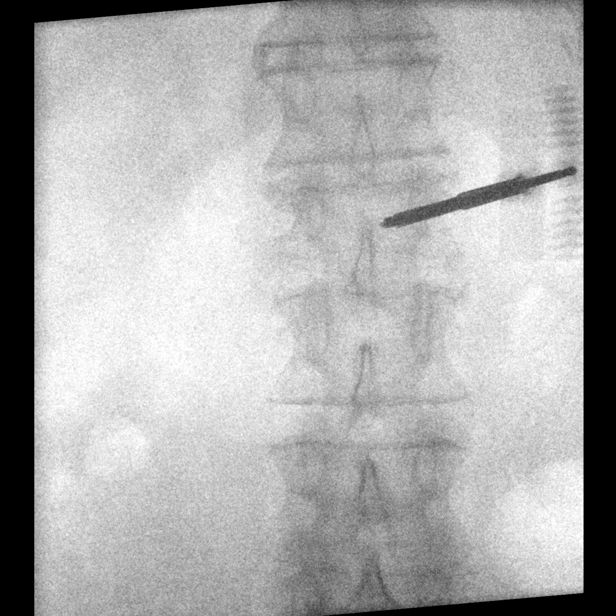
[im 2/16]
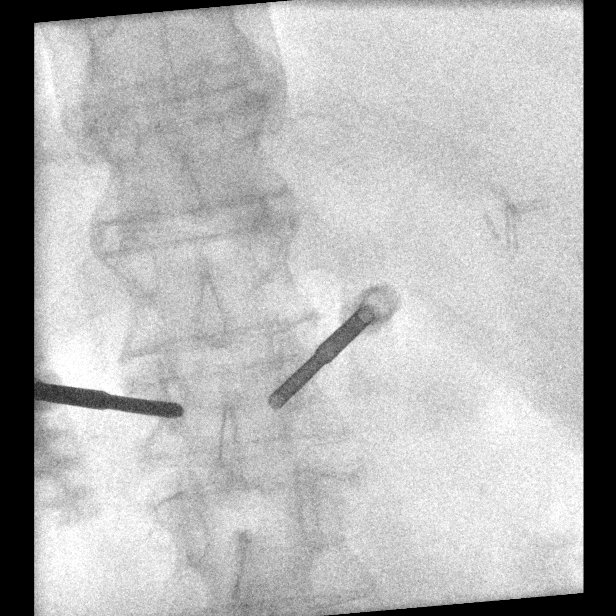
[im 4/16]
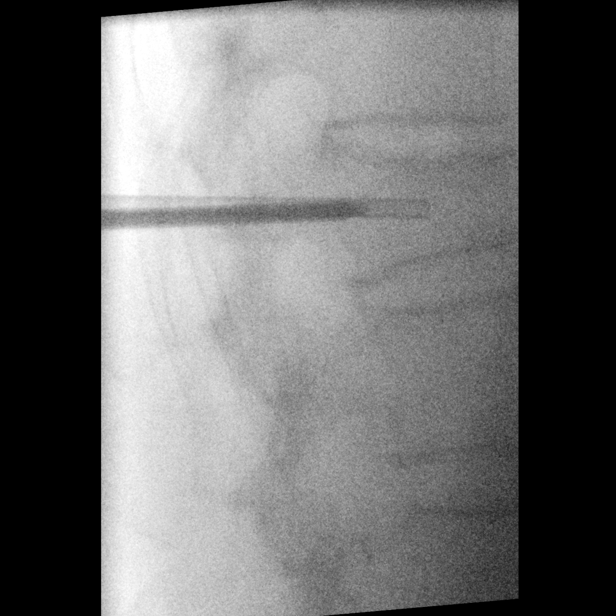
[im 5/16]
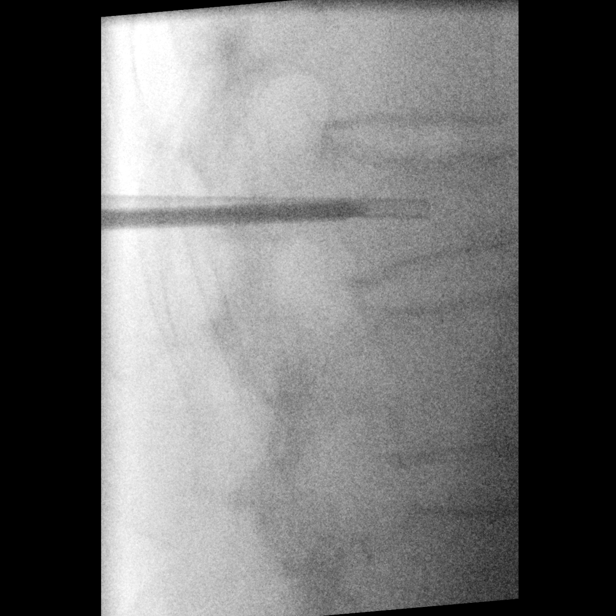
[im 6/16]
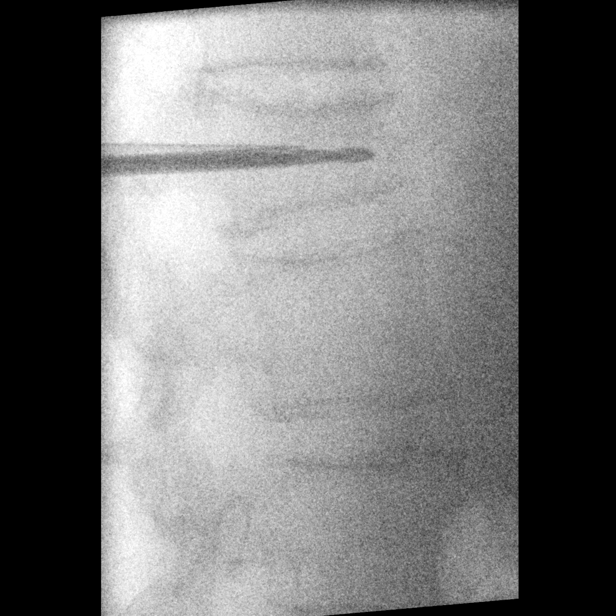
[im 7/16]
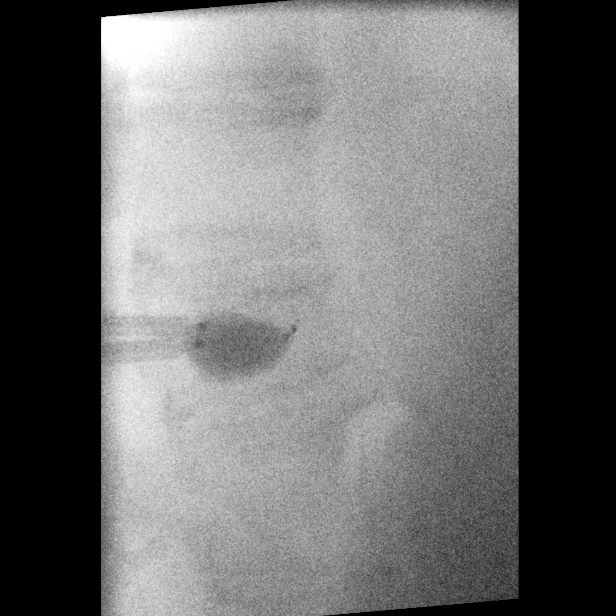
[im 9/16]
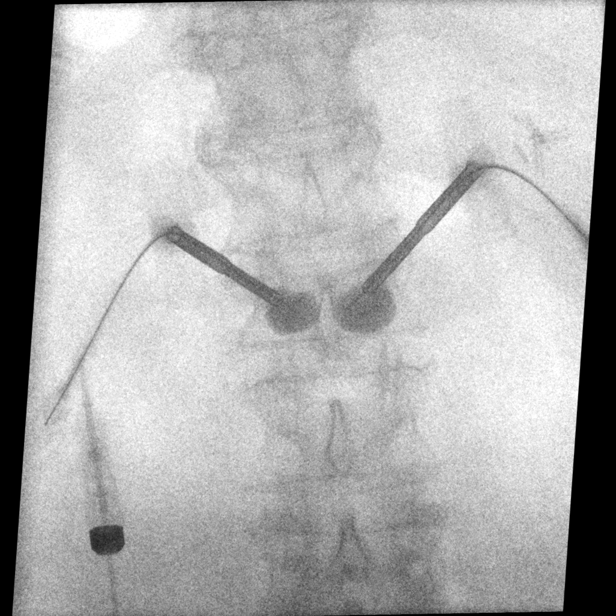
[im 10/16]
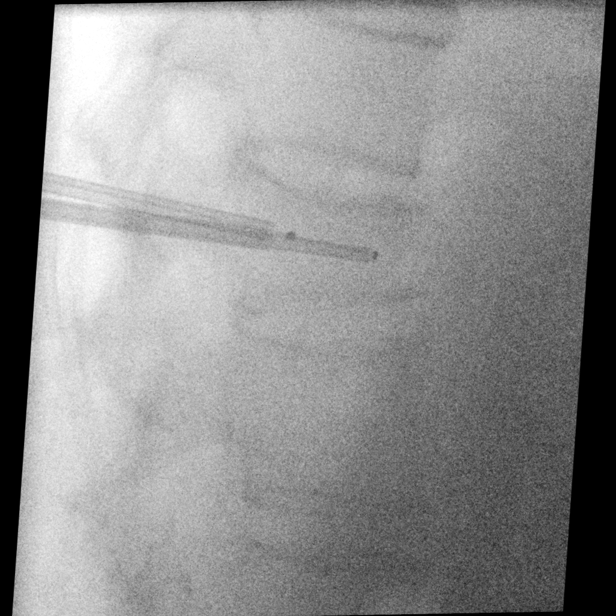
[im 11/16]
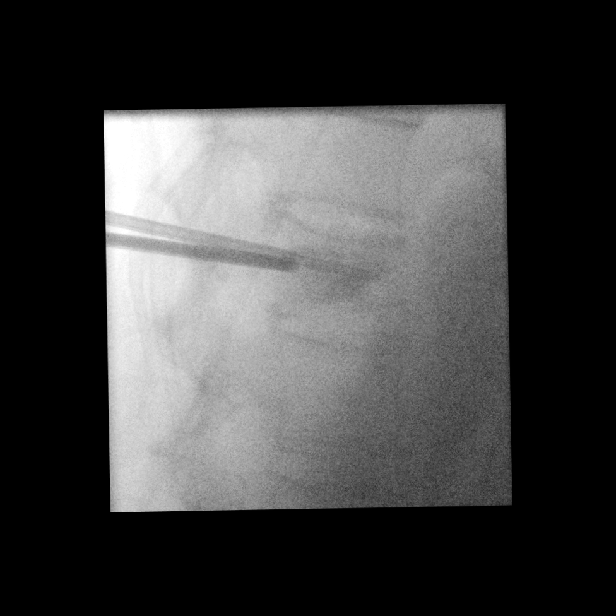
[im 12/16]
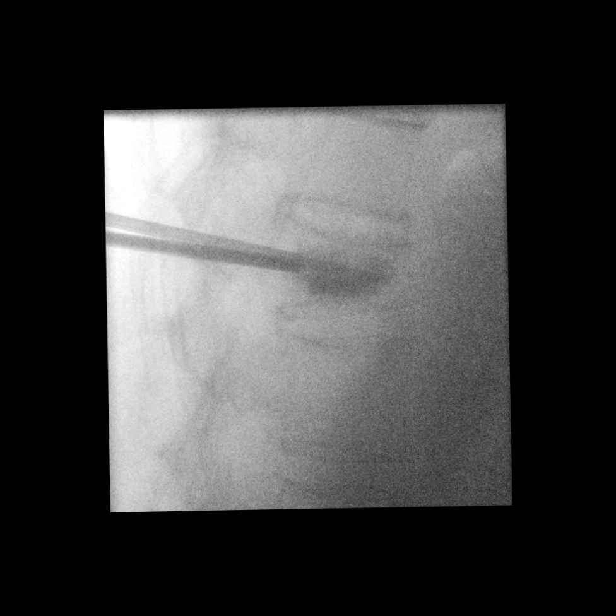
[im 13/16]
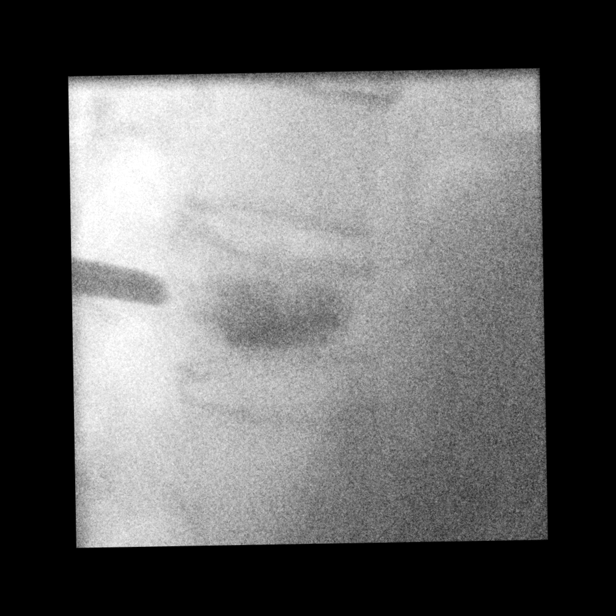
[im 15/16]
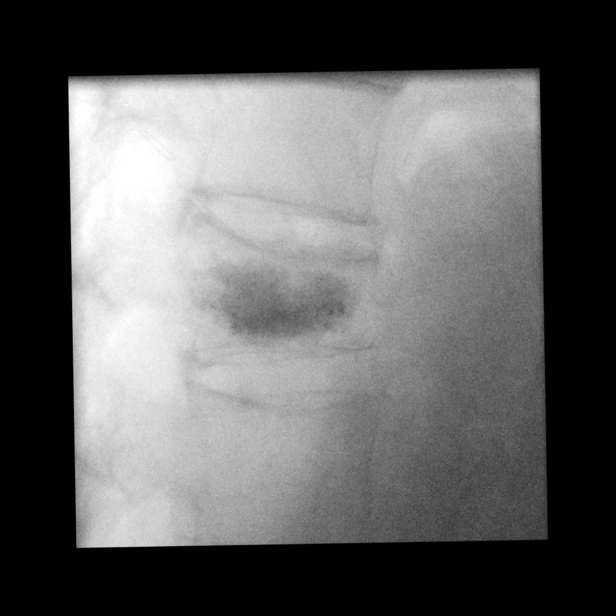
[im 16/16]
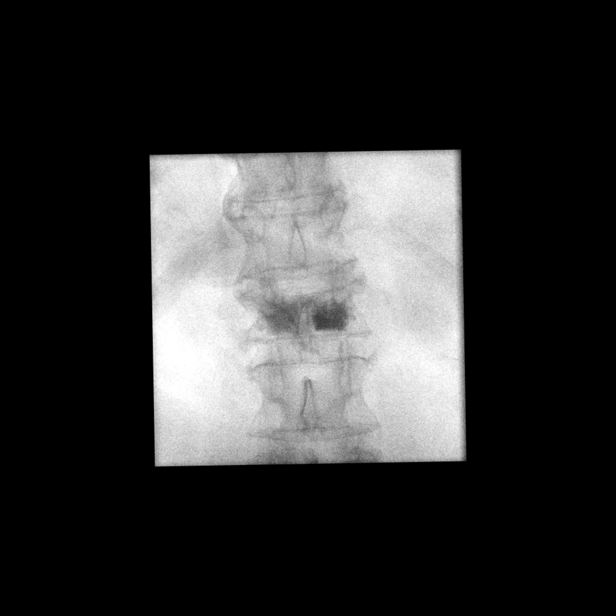

[13 of 16 positions shown; findings below may reference images not displayed]

MEDICATIONS:
Per EMR

ANESTHESIA/SEDATION:
Moderate (conscious) sedation was employed during this procedure. A
total of Versed 4 mg and Fentanyl 200 mcg was administered
intravenously by the radiology nurse.

Total intra-service moderate Sedation Time: 48 minutes. The
patient's level of consciousness and vital signs were monitored
continuously by radiology nursing throughout the procedure under my
direct supervision.

FLUOROSCOPY:
Radiation Exposure Index (as provided by the fluoroscopic device): 9
minutes (220 mGy)

COMPLICATIONS:
None immediate.

PROCEDURE:
Informed written consent was obtained from the patient after a
thorough discussion of the procedural risks, benefits and
alternatives. All questions were addressed. Maximal Sterile Barrier
Technique was utilized including caps, mask, sterile gowns, sterile
gloves, sterile drape, hand hygiene and skin antiseptic. A timeout
was performed prior to the initiation of the procedure.

The patient was positioned prone on the exam table. A bipedicular
access was planned. Skin entry site(s) were marked overlying the L1
vertebral body using fluoroscopy. The overlying skin was then
prepped and draped in the standard sterile fashion. Local analgesia
was obtained with 1% lidocaine. Attention was first turned to the
right side. Under fluoroscopic guidance, a 10 gauge introducer
needle was advanced towards the lateral margin of the pedicle. Using
multiple projections, the introducer needle was advanced towards the
posterior margin of the vertebral body via a transpedicular
approach. The inner needle was then removed, and the drill was
advanced towards the anterior margin of the vertebral body.

Attention was then turned to the contralateral side. Under
fluoroscopic guidance, a 10 gauge introducer needle was advanced
towards the lateral margin of the pedicle. Using multiple
projections, the introducer needle was advanced towards the
posterior margin of the vertebral body via a transpedicular
approach. The inner needle was then removed, and the drill was
advanced towards the anterior margin of the vertebral body.

The Kyphon 15 mm inflatable bone tamps were then advanced through
the bilateral transpedicular access needles and positioned within
the mid vertebral body. Kyphoplasty was then performed, ensuring
that the balloon contours stayed within the vertebral body margins.
The balloons were then deflated and removed, followed by advancement
of the bone filler devices bilaterally and the instillation of
acrylic bone cement with excellent filling in the AP and lateral
projections. No extravasation was noted in the disk spaces or
posteriorly into the spinal canal. No epidural venous contamination
was seen.

At the end of the procedure, the introducer cannulas and bone filler
devices were then removed without difficulty. Clean dressings were
placed after hemostasis. The patient tolerated all aspects of the
procedure well, and was transferred to recovery in stable condition.
IMPRESSION: 1. Successful vertebral body augmentation using balloon kyphoplasty
at L1.

The patient has suffered a fracture of the vertebral body. It is
recommended that patients aged 50 years or older be evaluated for
possible testing or treatment of osteoporosis. A copy of this
procedure report is sent to the patient's referring physician.
